# Patient Record
Sex: Female | Born: 1990 | Race: Asian | Hispanic: No | Marital: Married | Smoking: Never smoker
Health system: Southern US, Community
[De-identification: ages and names within clinical notes are randomized; demographics above are authoritative.]

## PROBLEM LIST (undated history)

## (undated) DIAGNOSIS — F419 Anxiety disorder, unspecified: Secondary | ICD-10-CM

## (undated) DIAGNOSIS — F32A Depression, unspecified: Secondary | ICD-10-CM

---

## 2016-07-22 ENCOUNTER — Ambulatory Visit (INDEPENDENT_AMBULATORY_CARE_PROVIDER_SITE_OTHER): Payer: 59 | Admitting: Family Medicine

## 2016-07-22 ENCOUNTER — Ambulatory Visit (INDEPENDENT_AMBULATORY_CARE_PROVIDER_SITE_OTHER): Payer: 59

## 2016-07-22 VITALS — BP 109/64 | HR 58

## 2016-07-22 DIAGNOSIS — M25531 Pain in right wrist: Secondary | ICD-10-CM | POA: Diagnosis not present

## 2016-07-22 DIAGNOSIS — S6991XA Unspecified injury of right wrist, hand and finger(s), initial encounter: Secondary | ICD-10-CM

## 2016-07-22 NOTE — Patient Instructions (Signed)
Thank you for coming in today. Let me know how your wrist is feeling.  I recommend you make an appointment with Dr Lyn HollingsheadAlexander for a well visit to establish care and get caught up with a pap smear.     Pap Test Why am I having this test? A pap test is sometimes called a pap smear. It is a screening test that is used to check for signs of cancer of the vagina, cervix, and uterus. The test can also identify the presence of infection or precancerous changes. Your health care provider will likely recommend you have this test done on a regular basis. This test may be done:  Every 3 years, starting at age 26.  Every 5 years, in combination with testing for the presence of human papillomavirus (HPV).  More or less often depending on other medical conditions. What kind of sample is taken? Using a small cotton swab, plastic spatula, or brush, your health care provider will collect a sample of cells from the surface of your cervix. Your cervix is the opening to your uterus, also called a womb. Secretions from the cervix and vagina may also be collected. How do I prepare for this test?  Be aware of where you are in your menstrual cycle. You may be asked to reschedule the test if you are menstruating on the day of the test.  You may need to reschedule if you have a known vaginal infection on the day of the test.  You may be asked to avoid douching or taking a bath the day before or the day of the test.  Some medicines can cause abnormal test results, such as digitalis and tetracycline. Talk with your health care provider before your test if you take one of these medicines. What do the results mean? Abnormal test results may indicate a number of health conditions. These may include:  Cancer. Although pap test results cannot be used to diagnose cancer of the cervix, vagina, or uterus, they may suggest the possibility of cancer. Further tests would be required to determine if cancer is  present.  Sexually transmitted disease.  Fungal infection.  Parasite infection.  Herpes infection.  A condition causing or contributing to infertility. It is your responsibility to obtain your test results. Ask the lab or department performing the test when and how you will get your results. Contact your health care provider to discuss any questions you have about your results. Talk with your health care provider to discuss your results, treatment options, and if necessary, the need for more tests. Talk with your health care provider if you have any questions about your results. This information is not intended to replace advice given to you by your health care provider. Make sure you discuss any questions you have with your health care provider. Document Released: 07/12/2002 Document Revised: 12/26/2015 Document Reviewed: 09/12/2013 Elsevier Interactive Patient Education  2017 [employer]
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
## 2016-08-05 ENCOUNTER — Other Ambulatory Visit (HOSPITAL_COMMUNITY)
Admission: RE | Admit: 2016-08-05 | Discharge: 2016-08-05 | Disposition: A | Discharge status: Home / Self Care | Payer: 59 | Source: Ambulatory Visit | Attending: Osteopathic Medicine | Admitting: Osteopathic Medicine

## 2016-08-05 ENCOUNTER — Encounter: Payer: Self-pay | Admitting: Osteopathic Medicine

## 2016-08-05 ENCOUNTER — Ambulatory Visit (INDEPENDENT_AMBULATORY_CARE_PROVIDER_SITE_OTHER): Payer: 59 | Admitting: Osteopathic Medicine

## 2016-08-05 VITALS — BP 126/63 | HR 72 | Ht 60.5 in | Wt 114.0 lb

## 2016-08-05 DIAGNOSIS — Z0000 Encounter for general adult medical examination without abnormal findings: Secondary | ICD-10-CM | POA: Insufficient documentation

## 2016-08-05 DIAGNOSIS — Z124 Encounter for screening for malignant neoplasm of cervix: Secondary | ICD-10-CM | POA: Diagnosis not present

## 2016-08-05 DIAGNOSIS — Z3009 Encounter for other general counseling and advice on contraception: Secondary | ICD-10-CM | POA: Diagnosis not present

## 2016-08-05 NOTE — Progress Notes (Signed)
HPI: Joyce Crawford is a 26 y.o. female  who presents to [facility] today, 08/05/16,  for chief complaint of:  Chief Complaint  Patient presents with  . Establish Care   Here to establish care with PCP. Due for some preventive medicine measures. Never had Pap smear performed. Patient is married, husband is here to assist with history taking. Patient speaks decent [place] and declines use of interpreter at this time. See below for review of preventive care  Has been spoke to me briefly about episode that she had which sounds like panic/anxiety attacks, happened when she recently moved to [place], they were going into a [employer] when she was hyperventilating and needed to sit down. He states that in [place], she also had some kind of what sounds like stress test performed, treadmill test for the heart which was normal. He is not sure why she needed this. Patient states that she used to have chest pains but does not anymore.    Past medical, surgical, social and family history reviewed: There are no active problems to display for this patient.  No past surgical history on file. Social History  Substance Use Topics  . Smoking status: Not on file  . Smokeless tobacco: Not on file  . Alcohol use Not on file   No family history on file.   Current medication list and allergy/intolerance information reviewed:   No current outpatient prescriptions on file.   No current facility-administered medications for this visit.    Not on File    Review of Systems:  Constitutional:  No  fever, no chills, No recent illness, No unintentional weight changes. No significant fatigue.   HEENT: No  headache, no vision change  Cardiac: No  chest pain, No  pressure, No palpitations  Respiratory:  No  shortness of breath. No  Cough  Gastrointestinal: No  abdominal pain, No  nausea, No  vomiting,  No  blood in stool, No  diarrhea, No  constipation    Musculoskeletal: No new myalgia/arthralgia  Genitourinary: No  abnormal genital bleeding, No abnormal genital discharge  Skin: No  Rash, No other wounds/concerning lesions  Neurologic: No  weakness, No  dizziness  Psychiatric: No  concerns with depression, No  concerns with anxiety, No sleep problems, No mood problems  Exam:  BP 126/63   Pulse 72   Ht 5' 0.5" (1.537 m)   Wt 114 lb (51.7 kg)   LMP 07/08/2016   BMI 21.90 kg/m   Constitutional: VS see above. General Appearance: alert, well-developed, well-nourished, NAD  Eyes: Normal lids and conjunctive, non-icteric sclera  Ears, Nose, Mouth, Throat: MMM, Normal external inspection ears/nares/mouth/lips/gums.   Neck: No masses, trachea midline. No thyroid enlargement. No tenderness/mass appreciated. No lymphadenopathy  Respiratory: Normal respiratory effort. no wheeze, no rhonchi, no rales  Cardiovascular: S1/S2 normal, no murmur, no rub/gallop auscultated. RRR. No lower extremity edema.   Gastrointestinal: Nontender, no masses. No hepatomegaly, no splenomegaly. No hernia appreciated. Bowel sounds normal. Rectal exam deferred.   Musculoskeletal: Gait normal. No clubbing/cyanosis of digits.   Neurological: Normal balance/coordination. No tremor.   Skin: warm, dry, intact.  Psychiatric: Normal judgment/insight. Normal mood and affect. Oriented x3.  GYN: No lesions/ulcers to external genitalia, normal urethra, normal vaginal mucosa, physiologic discharge, cervix normal without lesions, uterus not enlarged or tender, adnexa no masses and nontender    Results for orders placed or performed in visit on 08/05/16 (from the past 72 hour(s))  CBC  Status: None   Collection Time: 08/05/16  4:49 PM  Result Value Ref Range   WBC 7.4 3.8 - 10.8 K/uL   RBC 4.63 3.80 - 5.10 MIL/uL   Hemoglobin 13.2 11.7 - 15.5 g/dL   HCT 65.7 84.6 - 96.2 %   MCV 85.1 80.0 - 100.0 fL   MCH 28.5 27.0 - 33.0 pg   MCHC 33.5 32.0 - 36.0 g/dL    RDW 95.2 84.1 - 32.4 %   Platelets 347 140 - 400 K/uL   MPV 9.6 7.5 - 12.5 fL  COMPLETE METABOLIC PANEL WITH GFR     Status: Abnormal   Collection Time: 08/05/16  4:49 PM  Result Value Ref Range   Sodium 138 135 - 146 mmol/L   Potassium 4.0 3.5 - 5.3 mmol/L   Chloride 105 98 - 110 mmol/L   CO2 23 20 - 31 mmol/L   Glucose, Bld 89 65 - 99 mg/dL   BUN 12 7 - 25 mg/dL   Creat 4.01 0.27 - 2.53 mg/dL   Total Bilirubin 0.2 0.2 - 1.2 mg/dL   Alkaline Phosphatase 61 33 - 115 U/L   AST 17 10 - 30 U/L   ALT 4 (L) 6 - 29 U/L   Total Protein 7.7 6.1 - 8.1 g/dL   Albumin 4.4 3.6 - 5.1 g/dL   Calcium 9.3 8.6 - 66.4 mg/dL   GFR, Est African American >89 >=60 mL/min   GFR, Est Non African American >89 >=60 mL/min  TSH     Status: None   Collection Time: 08/05/16  4:49 PM  Result Value Ref Range   TSH 2.13 mIU/L    Comment:   Reference Range   > or = 20 Years  0.40-4.50   Pregnancy Range First trimester  0.26-2.66 Second trimester 0.55-2.73 Third trimester  0.43-2.91         ASSESSMENT/PLAN:   Annual physical exam - No major medical problems. Questionable history of possible anxiety. Labs for reassurance - Plan: CBC, COMPLETE METABOLIC PANEL WITH GFR, TSH  Cervical cancer screening - Plan: Cytology - PAP  Encounter for other general counseling or advice on contraception - Patient would like something she doesn't have to think about every day, will consider arm implant IUD versus vaginal ring    FEMALE PREVENTIVE CARE Updated 08/06/16   ANNUAL SCREENING/COUNSELING  Diet/Exercise - HEALTHY HABITS DISCUSSED TO DECREASE CV RISK History  Smoking Status  . Never Smoker  Smokeless Tobacco  . Never Used   History  Alcohol Use No    Comment: none   Depression screen PHQ 2/9 08/06/2016  Decreased Interest 0  Down, Depressed, Hopeless 0  PHQ - 2 Score 0    Domestic violence concerns - no  HTN SCREENING - SEE VITALS  SEXUAL HEALTH  Sexually active in the past year - Yes  with female.  Need/want STI testing today? - no  Concerns about libido or pain with sex? - no  Plans for pregnancy? - next few years - not on contraception plan currently  INFECTIOUS DISEASE SCREENING  HIV - needs - declined  GC/CT - needs  HepC - DOB [birth year] - does not need  TB - does not need  DISEASE SCREENING  Lipid - does not need  DM2 - does not need  Osteoporosis - women age 38+ - does not need  CANCER SCREENING  Cervical - needs  Breast - does not need  Lung - does not need  Colon - does not  need  ADULT VACCINATION  Influenza - annual vaccine recommended  Td - booster every 10 years   Zoster - option at 7, yes at 60+   PCV13 - was not indicated  PPSV23 - was not indicated  There is no immunization history on file for this patient. Husband states they will bring vaccination records from home       Visit summary with medication list and pertinent instructions was printed for patient to review. All questions at time of visit were answered - patient instructed to contact office with any additional concerns. ER/RTC precautions were reviewed with the patient. Follow-up plan: Return for birth control dicsussion if desired, and depending on results .

## 2016-08-05 NOTE — Patient Instructions (Signed)
Contraception Choices Contraception (birth control) is the use of any methods or devices to prevent pregnancy. Below are some methods to help avoid pregnancy. Hormonal methods  Contraceptive implant. This is a thin, plastic tube containing progesterone hormone. It does not contain estrogen hormone. Your health care provider inserts the tube in the inner part of the upper arm. The tube can remain in place for up to 3 years. After 3 years, the implant must be removed. The implant prevents the ovaries from releasing an egg (ovulation), thickens the cervical mucus to prevent sperm from entering the uterus, and thins the lining of the inside of the uterus.  Progesterone-only injections. These injections are given every 3 months by your health care provider to prevent pregnancy. This synthetic progesterone hormone stops the ovaries from releasing eggs. It also thickens cervical mucus and changes the uterine lining. This makes it harder for sperm to survive in the uterus.  Birth control pills. These pills contain estrogen and progesterone hormone. They work by preventing the ovaries from releasing eggs (ovulation). They also cause the cervical mucus to thicken, preventing the sperm from entering the uterus. Birth control pills are prescribed by a health care provider.Birth control pills can also be used to treat heavy periods.  Minipill. This type of birth control pill contains only the progesterone hormone. They are taken every day of each month and must be prescribed by your health care provider.  Birth control patch. The patch contains hormones similar to those in birth control pills. It must be changed once a week and is prescribed by a health care provider.  Vaginal ring. The ring contains hormones similar to those in birth control pills. It is left in the vagina for 3 weeks, removed for 1 week, and then a new one is put back in place. The patient must be comfortable inserting and removing the ring from  the vagina.A health care provider's prescription is necessary.  Emergency contraception. Emergency contraceptives prevent pregnancy after unprotected sexual intercourse. This pill can be taken right after sex or up to 5 days after unprotected sex. It is most effective the sooner you take the pills after having sexual intercourse. Most emergency contraceptive pills are available without a prescription. Check with your pharmacist. Do not use emergency contraception as your only form of birth control. Barrier methods  Female condom. This is a thin sheath (latex or rubber) that is worn over the penis during sexual intercourse. It can be used with spermicide to increase effectiveness.  Female condom. This is a soft, loose-fitting sheath that is put into the vagina before sexual intercourse.  Diaphragm. This is a soft, latex, dome-shaped barrier that must be fitted by a health care provider. It is inserted into the vagina, along with a spermicidal jelly. It is inserted before intercourse. The diaphragm should be left in the vagina for 6 to 8 hours after intercourse.  Cervical cap. This is a round, soft, latex or plastic cup that fits over the cervix and must be fitted by a health care provider. The cap can be left in place for up to 48 hours after intercourse.  Sponge. This is a soft, circular piece of polyurethane foam. The sponge has spermicide in it. It is inserted into the vagina after wetting it and before sexual intercourse.  Spermicides. These are chemicals that kill or block sperm from entering the cervix and uterus. They come in the form of creams, jellies, suppositories, foam, or tablets. They do not require a prescription. They   are inserted into the vagina with an applicator before having sexual intercourse. The process must be repeated every time you have sexual intercourse. Intrauterine contraception  Intrauterine device (IUD). This is a T-shaped device that is put in a woman's uterus during  a menstrual period to prevent pregnancy. There are 2 types:  Copper IUD. This type of IUD is wrapped in copper wire and is placed inside the uterus. Copper makes the uterus and fallopian tubes produce a fluid that kills sperm. It can stay in place for 10 years.  Hormone IUD. This type of IUD contains the hormone progestin (synthetic progesterone). The hormone thickens the cervical mucus and prevents sperm from entering the uterus, and it also thins the uterine lining to prevent implantation of a fertilized egg. The hormone can weaken or kill the sperm that get into the uterus. It can stay in place for 3-5 years, depending on which type of IUD is used. Permanent methods of contraception  Female tubal ligation. This is when the woman's fallopian tubes are surgically sealed, tied, or blocked to prevent the egg from traveling to the uterus.  Hysteroscopic sterilization. This involves placing a small coil or insert into each fallopian tube. Your doctor uses a technique called hysteroscopy to do the procedure. The device causes scar tissue to form. This results in permanent blockage of the fallopian tubes, so the sperm cannot fertilize the egg. It takes about 3 months after the procedure for the tubes to become blocked. You must use another form of birth control for these 3 months.  Female sterilization. This is when the female has the tubes that carry sperm tied off (vasectomy).This blocks sperm from entering the vagina during sexual intercourse. After the procedure, the man can still ejaculate fluid (semen). Natural planning methods  Natural family planning. This is not having sexual intercourse or using a barrier method (condom, diaphragm, cervical cap) on days the woman could become pregnant.  Calendar method. This is keeping track of the length of each menstrual cycle and identifying when you are fertile.  Ovulation method. This is avoiding sexual intercourse during ovulation.  Symptothermal method.  This is avoiding sexual intercourse during ovulation, using a thermometer and ovulation symptoms.  Post-ovulation method. This is timing sexual intercourse after you have ovulated. Regardless of which type or method of contraception you choose, it is important that you use condoms to protect against the transmission of sexually transmitted infections (STIs). Talk with your health care provider about which form of contraception is most appropriate for you. This information is not intended to replace advice given to you by your health care provider. Make sure you discuss any questions you have with your health care provider. Document Released: 04/21/2005 Document Revised: 09/27/2015 Document Reviewed: 10/14/2012 Elsevier Interactive Patient Education  2017 [employer].   Cc bi?n php trnh [place] (Contraception Choices) [place] thai (ki?m sot sinh s?n) l vi?c s? d?ng b?t k? ph??ng php ho?c d?ng c? no ?? trnh [place]. D??i ?y l m?t s? ph??ng php trnh [place]. PH??NG PHP HOC MN  C?y ghp trnh [place]. ?y l m?t ?ng m?ng b?ng nh?a ch?a hoc mn progesterone. N khng ch?a hoc mn estrogen. Chuyn gia ch?m [place] s?c kh?e c?a qu v? s? lu?n ?ng vo ph?n bn trong c?a cnh tay pha trn. ?ng c th? v?n ? ? cho ??n 3 n?m. Sau 3 n?m, ?ng c?y ghp ph?i ???c tho ra. ?ng c?y ghp ng?n khng cho bu?ng tr?ng nh? tr?ng ra (r?ng tr?ng), lm ??c  ch?t nh?y ? c? t? cung ?? ng?n khng cho tinh trng xm nh?p vo t? cung, v lm m?ng nim m?c bn trong t? cung.  Tim ch? ring progesterone. Chuyn gia ch?m [place] s?c kh?e s? tim cho qu v? 3 thng m?t l?n ?? trnh [place]. Hoc mn progesterone t?ng h?p ny d?ng vi?c r?ng tr?ng t? bu?ng tr?ng. N c?ng lm ??c ch?t nh?y c? t? cung v thay ??i n?i m?c t? cung. ?i?u ny lm cho tinh trng kh t?n t?i h?n trong t? cung.  Vin thu?c trnh [place]. Nh?ng vin thu?c ny ch?a hoc mn estrogen v progesterone. Chng ho?t ??ng b?ng cch ng?n khng cho bu?ng tr?ng gi?i phng tr?ng  (r?ng tr?ng). Chng c?ng lm ch?t nh?y ? c? t? cung ??c l?i, t? ? ng?n c?n tinh trng xm nh?p vo t? cung. Thu?c trnh [place] ???c chuyn gia ch?m [place] s?c kh?e k ??n.Thu?c trnh [place] c?ng c th? ???c dng ?? ?i?u tr? rong kinh.  Vin thu?c minipil. Lo?i vin trnh [place] ny ch? ch?a hoc mn progesterone. Chng ???c dng hng ngy c?a m?i thng v ph?i ???c chuyn gia ch?m [place] s?c kh?e c?a qu v? k ??n.  Mi?ng dn trnh [place]. Mi?ng dn c ch?a hoc mn t??ng t? nh? trong vin thu?c trnh [place]. N ph?i ???c thay ??i m?t l?n m?t tu?n v ph?i do chuyn gia ch?m [place] s?c kh?e k ??n.  Vng ??t m ??o. Vng ??t c ch?a hoc mn t??ng t? nh? trong vin thu?c trnh [place]. N n?m l?i trong m ??o trong 3 tu?n, ???c tho ra trong 1 tu?n, v sau ? m?t vng m?i ???c ??t l?i vo ch? c?. B?nh nhn ph?i th?y tho?i mi khi ??t v tho vng t? m ??o.C?n ph?i c ??n thu?c c?a m?t chuyn gia ch?m [place] s?c kh?e.  [place] thai kh?n c?p. Thu?c trnh thai kh?n c?p ng?n ng?a vi?c mang thai sau khi quan h? tnh d?c khng c bi?n php b?o v?. Vin thu?c ny c th? u?ng ngay sau khi quan h? tnh d?c ho?c ??n 5 ngy sau khi quan h? tnh d?c khng c bi?n php b?o v?. Thu?c ny hi?u qu? nh?t khi qu v? dng thu?c s?m sau khi quan h? tnh d?c. H?u h?t cc vin thu?c trnh [place] kh?n c?p c bn m khng c?n k ??n. Ki?m tra v?i d??c s? c?a qu v?. Khng s? d?ng bi?n php trnh [place] kh?n c?p nh? l hnh th?c duy nh?t ?? ki?m sot sinh ??. PH??NG PHP MNG NG?N  Bao cao su cho nam. ?y l m?t bao m?ng (latex hay cao su) ???c ?eo vo d??ng v?t trong khi giao h?p. N c th? ???c s? d?ng v?i ch?t di?t tinh trng ?? t?ng hi?u qu?[place] su cho n?. ?y l m?t mng m?m, r?ng ???c ??a vo m ??o tr??c khi giao h?p.  Mng ch?n. ?y l m?t mng ch?n m?m b?ng latex, c hnh vm m ph?i ???c m?t chuyn gia ch?m [place] s?c kh?e ??t. N ???c ??t vo m ??o, cng v?i m?t ch?t th?ch ?? di?t tinh trng. N ???c ??t vo tr??c khi giao h?p. Mng ch?n  nn ?? trong m ??o trong 6-8 gi? sau khi giao h?p.  M? c? t? cung. ?y l m?t c?c hnh trn, m?m b?ng latex ??t ln c? t? cung v ph?i ???c m?t chuyn gia ch?m [place] s?c kh?e ??t. M? ny c th? ???c gi? ? t?i ch? cho ??n  48 gi? sau khi giao h?p.  Mi?ng x?p. ?y l m?t mi?ng x?p polyurethane m?m. Mi?ng x?p c ch?t di?t tinh trng trong ?. N ???c ??a vo m ??o sau khi lm ??t v tr??c khi giao h?p.  Ch?t di?t tinh trng. ?y l nh?ng ha ch?t di?t ho?c ng?n khng cho tinh trng xm nh?p vo c? t? cung v t? cung. Chng c d??i d?ng kem, th?ch, thu?c ??n, b?t, ho?c vin. Nh?ng ch?t ny khng c?n k ??n thu?c. Chng ???c ??a vo m ??o b?ng m?t d?ng c? bi tr??c khi giao h?p. Qu trnh ny ph?i ???c l?p l?i m?i khi qu v? giao h?p. TRNH THAI TRONG T? CUNG  Vng trnh [place] (IUD). ?y l m?t d?ng c? hnh ch? T ???c ??t vo t? cung c?a ng??i ph? n? trong k? kinh nguy?t ?? trnh [place]. C 2 lo?i vng trnh thai:  IUD b?ng ??ng. ?y l lo?i IUD ???c b?c trong dy ??ng v ???c ??t bn trong t? cung. ??ng lm cho t? cung v ?ng d?n tr?ng s?n xu?t ra m?t ch?t l?ng tiu di?t tinh trng. N c th? ? l?i t?i ch? trong 10 n?m.  IUD hoc mn. ?y l lo?i IUD ch?a progestin hoc mn (progesterone t?ng h?p). Hoc mn s? lm ??c ch?t nh?y c? t? cung v ng?n khng cho tinh trng xm nh?p vo t? cung, v n c?ng lm m?ng nim m?c t? cung ?? ng?n ch?n vi?c c?y tr?ng ? th? tinh. Hoc mn c th? lm suy y?u ho?c tiu di?t tinh trng xm nh?p vo t? cung. N c th? ? l?i t?i ch? trong 3-5 n?m, ty thu?c vo lo?i vng trnh [place] ???c s? d?ng. CC PH??NG PHP TRNH THAI V?NH VI?N  Th?t ?ng d?n tr?ng ? n?. ? l khi ?ng d?n tr?ng c?a ng??i ph? n? ???c b?t, th?t, ho?c ?ng l?i b?ng ph?u thu?t ?? ng?n khng cho tr?ng ?i vo t? cung.  Tri?t s?n b?ng n?i soi t? cung. Bi?n php ny bao g?m vi?c ??t m?t cu?n dy nh? ho?c lu?n vo t?ng ?ng d?n tr?ng. Bc s? c?a qu v? s? d?ng m?t k? thu?t g?i l n?i soi t? cung ?? lm th? thu?t  ny. D?ng c? ny lm hnh thnh m s?o. ?i?u ny d?n ??n t?c ?ng d?n tr?ng v?nh vi?n, do ? tinh trng khng th? th? tinh cho tr?ng. C?n kho?ng 3 thng sau khi lm th? thu?t ?? cho cc ?ng d?n tr?ng b? t?c. Qu v? ph?i s? d?ng m?t hnh th?c trnh [place] khc trong 3 thng ny.  Tri?t s?n nam. ? l khi nam gi?i th?t cc ?ng d?n tinh (th?t ?ng d?n tinh).Th? thu?t ny ng?n khng cho tinh trng vo m ??o khi giao h?p. Sau khi lm th? thu?t, nam gi?i v?n c th? xu?t ra ch?t l?ng (tinh d?ch). CC PH??NG PHP K? HO?CH T? NHIN  K? ho?ch ha gia ?nh t? nhin. ? l khng c giao h?p ho?c s? d?ng m?t ph??ng php mng ch?n (bao cao su, mng ng?n, m? c? t? cung) vo nh?ng ngy ng??i ph? n? c th? mang [place].  Ph??ng php theo l?ch. ?y l vi?c theo di ?? di c?a m?i chu k? kinh nguy?t v xc ??nh khi no qu v? c th? mang [place].  Ph??ng php r?ng tr?ng. ?y l vi?c trnh giao h?p trong th?i gian r?ng tr?ng.  Ph??ng php tri?u ch?ng - nhi?t (Symptothermal). ?y l vi?c trnh giao h?p trong th?i gian  r?ng tr?ng, s? d?ng m?t nhi?t k? v cc tri?u ch?ng c?a r?ng tr?ng.  Ph??ng php sau r?ng tr?ng. ?y l vi?c ??nh th?i gian giao h?p sau khi qu v? ? r?ng tr?ng. B?t k? lo?i ho?c bi?n php trnh [place] m qu v? ch?n, ?i?u quan tr?ng l qu v? ph?i s? d?ng bao cao su ?? b?o v? ch?ng l?i s? ly nhi?m c?a cc b?nh qua ???ng tnh d?c (STI). Ni chuy?n v?i chuyn gia ch?m [place] s?c kh?e c?a qu v? v? ph??ng php trnh [place] thch h?p nh?t cho qu v?. Thng tin ny khng nh?m m?c ?ch thay th? cho l?i khuyn m chuyn gia ch?m [place] s?c kh?e ni v?i qu v?. Hy b?o ??m qu v? ph?i th?o lu?n b?t k? v?n ?? g m qu v? c v?i chuyn gia ch?m [place] s?c kh?e c?a qu v?. Document Released: 05/18/2015 Document Revised: 05/18/2015 Document Reviewed: 10/14/2012 Elsevier Interactive Patient Education  2017 [employer].

## 2016-08-06 ENCOUNTER — Encounter: Payer: Self-pay | Admitting: Osteopathic Medicine

## 2016-08-06 LAB — CBC
HEMATOCRIT: 39.4 % (ref 35.0–45.0)
HEMOGLOBIN: 13.2 g/dL (ref 11.7–15.5)
MCH: 28.5 pg (ref 27.0–33.0)
MCHC: 33.5 g/dL (ref 32.0–36.0)
MCV: 85.1 fL (ref 80.0–100.0)
MPV: 9.6 fL (ref 7.5–12.5)
Platelets: 347 10*3/uL (ref 140–400)
RBC: 4.63 MIL/uL (ref 3.80–5.10)
RDW: 13 % (ref 11.0–15.0)
WBC: 7.4 10*3/uL (ref 3.8–10.8)

## 2016-08-06 LAB — COMPLETE METABOLIC PANEL WITH GFR
ALBUMIN: 4.4 g/dL (ref 3.6–5.1)
ALK PHOS: 61 U/L (ref 33–115)
ALT: 4 U/L — ABNORMAL LOW (ref 6–29)
AST: 17 U/L (ref 10–30)
BUN: 12 mg/dL (ref 7–25)
CALCIUM: 9.3 mg/dL (ref 8.6–10.2)
CO2: 23 mmol/L (ref 20–31)
CREATININE: 0.75 mg/dL (ref 0.50–1.10)
Chloride: 105 mmol/L (ref 98–110)
GFR, Est African American: >89 mL/min (ref >=60)
GFR, Est Non African American: >89 mL/min (ref >=60)
GLUCOSE: 89 mg/dL (ref 65–99)
POTASSIUM: 4 mmol/L (ref 3.5–5.3)
SODIUM: 138 mmol/L (ref 135–146)
TOTAL PROTEIN: 7.7 g/dL (ref 6.1–8.1)
Total Bilirubin: 0.2 mg/dL (ref 0.2–1.2)

## 2016-08-06 LAB — TSH: TSH: 2.13 m[IU]/L

## 2016-08-08 LAB — CYTOLOGY - PAP
CHLAMYDIA, DNA PROBE: NEGATIVE
Diagnosis: NEGATIVE
Neisseria Gonorrhea: NEGATIVE

## 2018-01-22 ENCOUNTER — Ambulatory Visit: Payer: 59 | Admitting: Family Medicine

## 2018-01-22 VITALS — BP 120/70 | HR 71 | Temp 97.8°F | Wt 125.0 lb

## 2018-01-22 DIAGNOSIS — J029 Acute pharyngitis, unspecified: Secondary | ICD-10-CM

## 2018-01-22 LAB — POCT RAPID STREP A (OFFICE): Rapid Strep A Screen: NEGATIVE

## 2018-01-22 MED ORDER — BENZONATATE 200 MG PO CAPS: 200.0000 mg | ORAL_CAPSULE | Freq: Three times a day (TID) | ORAL | 0 refills | Status: DC | PRN

## 2018-01-22 NOTE — Progress Notes (Signed)
       Joyce Crawford is a 27 y.o. female [facility] today for 3-day history of sore throat with mild cough.  Mild subjective fevers.  No vomiting diarrhea chest pain palpitations.  Patient is tried some over-the-counter medications which help a bit.  She notes the cough is not particularly bothersome.  No chest pain palpitations or diarrhea.   ROS as above:  Exam:  BP 120/70   Pulse 71   Temp 97.8 F (36.6 C) (Oral)   Wt 125 lb (56.7 kg)   SpO2 100%   BMI 24.01 kg/m  Wt Readings from Last 5 Encounters:  01/22/18 125 lb (56.7 kg)  08/05/16 114 lb (51.7 kg)    Gen: Well NAD HEENT: EOMI,  MMM posterior pharynx minimally erythematous.  Mild cervical lymphadenopathy.  Normal tympanic membranes bilaterally. Lungs: Normal work of breathing. CTABL Heart: RRR no MRG Abd: NABS, Soft. Nondistended, Nontender Exts: Brisk capillary refill, warm and well perfused.   Lab and Radiology Results Results for orders placed or performed in visit on 01/22/18 (from the past 72 hour(s))  POCT rapid strep A     Status: None   Collection Time: 01/22/18 11:43 AM  Result Value Ref Range   Rapid Strep A Screen Negative Negative   No results found.    Assessment and Plan: 27 y.o. female with viral URI.  Plan for watchful waiting with symptomatic management with over-the-counter medications.  Prescribed Tessalon Perles for cough suppression if needed.  Recheck in the future if not improving.  Recommend patient follow-up with PCP in a month for a well woman visit and receive influenza and Tdap vaccine at that visit. Orders Placed This Encounter  Procedures  . POCT rapid strep A   Meds ordered this encounter  Medications  . benzonatate (TESSALON) 200 MG capsule    Sig: Take 1 capsule (200 mg total) by mouth 3 (three) times daily as needed for cough.    Dispense:  45  capsule    Refill:  0     Historical information moved to improve visibility of documentation.  No past medical history on file.  Reviewed No past surgical history on file. Social History   Tobacco Use  . Smoking status: Never Smoker  . Smokeless tobacco: Never Used  Substance Use Topics  . Alcohol use: No    Comment: none   family history is not on file.  Medications: Current Outpatient Medications  Medication Sig Dispense Refill  . benzonatate (TESSALON) 200 MG capsule Take 1 capsule (200 mg total) by mouth 3 (three) times daily as needed for cough. 45 capsule 0   No current facility-administered medications for this visit.    No Known Allergies   Discussed warning signs or symptoms. Please see discharge instructions. Patient expresses understanding.

## 2018-01-22 NOTE — Patient Instructions (Addendum)
Thank you for coming in today. Continue over the counter medicine.  Maxum dose of acetaminophen is 1000mg  every 6 hours.  Ok to also take ibuprofen with acetaminophen.  Fill and take the prescription cough medicine as needed if not improving or if worsening.  Recheck as needed.    Upper Respiratory Infection, Adult Most upper respiratory infections (URIs) are a viral infection of the air passages leading to the lungs. A URI affects the nose, throat, and upper air passages. The most common type of URI is nasopharyngitis and is typically referred to as "the common cold." URIs run their course and usually go away on their own. Most of the time, a URI does not require medical attention, but sometimes a bacterial infection in the upper airways can follow a viral infection. This is called a secondary infection. Sinus and middle ear infections are common types of secondary upper respiratory infections. Bacterial pneumonia can also complicate a URI. A URI can worsen asthma and chronic obstructive pulmonary disease (COPD). Sometimes, these complications can require emergency medical care and may be life threatening. What are the causes? Almost all URIs are caused by viruses. A virus is a type of germ and can spread from one person to another. What increases the risk? You may be at risk for a URI if:  You smoke.  You have chronic heart or lung disease.  You have a weakened defense (immune) system.  You are very young or very old.  You have nasal allergies or asthma.  You work in crowded or poorly ventilated areas.  You work in health care facilities or schools.  What are the signs or symptoms? Symptoms typically develop 2-3 days after you come in contact with a cold virus. Most viral URIs last 7-10 days. However, viral URIs from the influenza virus (flu virus) can last 14-18 days and are typically more severe. Symptoms may include:  Runny or stuffy (congested)  nose.  Sneezing.  Cough.  Sore throat.  Headache.  Fatigue.  Fever.  Loss of appetite.  Pain in your forehead, behind your eyes, and over your cheekbones (sinus pain).  Muscle aches.  How is this diagnosed? Your health care provider may diagnose a URI by:  Physical exam.  Tests to check that your symptoms are not due to another condition such as: ? Strep throat. ? Sinusitis. ? Pneumonia. ? Asthma.  How is this treated? A URI goes away on its own with time. It cannot be cured with medicines, but medicines may be prescribed or recommended to relieve symptoms. Medicines may help:  Reduce your fever.  Reduce your cough.  Relieve nasal congestion.  Follow these instructions at home:  Take medicines only as directed by your health care provider.  Gargle warm saltwater or take cough drops to comfort your throat as directed by your health care provider.  Use a warm mist humidifier or inhale steam from a shower to increase air moisture. This may make it easier to breathe.  Drink enough fluid to keep your urine clear or pale yellow.  Eat soups and other clear broths and maintain good nutrition.  Rest as needed.  Return to work when your temperature has returned to normal or as your health care provider advises. You may need to stay home longer to avoid infecting others. You can also use a face mask and careful hand washing to prevent spread of the virus.  Increase the usage of your inhaler if you have asthma.  Do not use any  tobacco products, including cigarettes, chewing tobacco, or electronic cigarettes. If you need help quitting, ask your health care provider. How is this prevented? The best way to protect yourself from getting a cold is to practice good hygiene.  Avoid oral or hand contact with people with cold symptoms.  Wash your hands often if contact occurs.  There is no clear evidence that vitamin C, vitamin E, echinacea, or exercise reduces the chance  of developing a cold. However, it is always recommended to get plenty of rest, exercise, and practice good nutrition. Contact a health care provider if:  You are getting worse rather than better.  Your symptoms are not controlled by medicine.  You have chills.  You have worsening shortness of breath.  You have brown or red mucus.  You have yellow or brown nasal discharge.  You have pain in your face, especially when you bend forward.  You have a fever.  You have swollen neck glands.  You have pain while swallowing.  You have white areas in the back of your throat. Get help right away if:  You have severe or persistent: ? Headache. ? Ear pain. ? Sinus pain. ? Chest pain.  You have chronic lung disease and any of the following: ? Wheezing. ? Prolonged cough. ? Coughing up blood. ? A change in your usual mucus.  You have a stiff neck.  You have changes in your: ? Vision. ? Hearing. ? Thinking. ? Mood. This information is not intended to replace advice given to you by your health care provider. Make sure you discuss any questions you have with your health care provider. Document Released: 10/15/2000 Document Revised: 12/23/2015 Document Reviewed: 07/27/2013 Elsevier Interactive Patient Education  [employer]2018 [employer]

## 2020-05-02 ENCOUNTER — Other Ambulatory Visit: Payer: Self-pay

## 2020-05-02 ENCOUNTER — Ambulatory Visit (INDEPENDENT_AMBULATORY_CARE_PROVIDER_SITE_OTHER): Payer: Managed Care, Other (non HMO) | Admitting: Osteopathic Medicine

## 2020-05-02 ENCOUNTER — Encounter: Payer: Self-pay | Admitting: Osteopathic Medicine

## 2020-05-02 VITALS — BP 117/74 | HR 70 | Temp 98.0°F | Wt 126.1 lb

## 2020-05-02 DIAGNOSIS — Z3201 Encounter for pregnancy test, result positive: Secondary | ICD-10-CM | POA: Diagnosis not present

## 2020-05-02 DIAGNOSIS — N926 Irregular menstruation, unspecified: Secondary | ICD-10-CM | POA: Diagnosis not present

## 2020-05-02 DIAGNOSIS — Z3A08 8 weeks gestation of pregnancy: Secondary | ICD-10-CM | POA: Diagnosis not present

## 2020-05-02 LAB — POCT URINE PREGNANCY: Preg Test, Ur: POSITIVE — AB

## 2020-05-02 MED ORDER — ONDANSETRON 8 MG PO TBDP: 8.0000 mg | ORAL_TABLET | Freq: Three times a day (TID) | ORAL | 1 refills | Status: DC | PRN

## 2020-05-02 NOTE — Patient Instructions (Addendum)
Never mind, we don't need a blood test! Pregnancy test is positive! I will refer you to OBGYN. I will send some medicine for nausea.   Results for orders placed or performed in visit on 05/02/20 (from the past 24 hour(s))  POCT urine pregnancy     Status: Abnormal   Collection Time: 05/02/20  3:57 PM  Result Value Ref Range   Preg Test, Ur Positive (A) Negative

## 2020-05-02 NOTE — Progress Notes (Signed)
Joyce Crawford is a 29 y.o. female who presents to  [facility] at [facility]  today, 05/02/20, seeking care for the following:  . LMP 03/01/2020     ASSESSMENT & PLAN with other pertinent findings:  The primary encounter diagnosis was [redacted] weeks gestation of pregnancy. A diagnosis of Missed period was also pertinent to this visit.   No results found for this or any previous visit (from the past 24 hour(s)).     Patient Instructions   Never mind, we don't need a blood test! Pregnancy test is positive! I will refer you to OBGYN. I will send some medicine for nausea.   Results for orders placed or performed in visit on 05/02/20 (from the past 24 hour(s))  POCT urine pregnancy     Status: Abnormal   Collection Time: 05/02/20  3:57 PM  Result Value Ref Range   Preg Test, Ur Positive (A) Negative       Orders Placed This Encounter  Procedures  . Ambulatory referral to Obstetrics / Gynecology  . POCT urine pregnancy    Meds ordered this encounter  Medications  . ondansetron (ZOFRAN-ODT) 8 MG disintegrating tablet    Sig: Take 1 tablet (8 mg total) by mouth every 8 (eight) hours as needed for nausea.    Dispense:  60 tablet    Refill:  1       Follow-up instructions: Return for recheck as needed.                                         BP 117/74   Pulse 70   Temp 98 F (36.7 C)   Wt 126 lb 1.9 oz (57.2 kg)   LMP 03/01/2020 Comment: per patient  SpO2 99%   BMI 24.23 kg/m   Current Meds  Medication Sig  . benzonatate (TESSALON) 200 MG capsule Take 1 capsule (200 mg total) by mouth 3 (three) times daily as needed for cough.  . ondansetron (ZOFRAN-ODT) 8 MG disintegrating tablet Take 1 tablet (8 mg total) by mouth every 8 (eight) hours as needed for nausea.    Results for orders placed or performed in visit on 05/02/20 (from the past 72 hour(s))  POCT urine pregnancy     Status:  Abnormal   Collection Time: 05/02/20  3:57 PM  Result Value Ref Range   Preg Test, Ur Positive (A) Negative    No results found.     All questions at time of visit were answered - patient instructed to contact office with any additional concerns or updates.  ER/RTC precautions were reviewed with the patient as applicable.   Please note: voice recognition software was used to produce this document, and typos may escape review. Please contact Dr. Lyn Hollingshead for any needed clarifications.   Total time spent in this in-person encounter was 30 minutes performing my duty and my job as a physician: record review, history-taking and assessment of patient, counseling patient and ensuring understanding of plan, coordinating care which may include but is not limited to further testing or referral. Any modifiers needed may be added by billing department as necessary if I have lapsed in doing so.

## 2020-05-14 ENCOUNTER — Encounter: Payer: Self-pay | Admitting: *Deleted

## 2020-05-15 ENCOUNTER — Ambulatory Visit (INDEPENDENT_AMBULATORY_CARE_PROVIDER_SITE_OTHER): Payer: Managed Care, Other (non HMO) | Admitting: Certified Nurse Midwife

## 2020-05-15 ENCOUNTER — Encounter: Payer: Self-pay | Admitting: Certified Nurse Midwife

## 2020-05-15 ENCOUNTER — Other Ambulatory Visit: Payer: Self-pay

## 2020-05-15 ENCOUNTER — Other Ambulatory Visit (HOSPITAL_COMMUNITY)
Admission: RE | Admit: 2020-05-15 | Discharge: 2020-05-15 | Disposition: A | Discharge status: Home / Self Care | Payer: Managed Care, Other (non HMO) | Source: Ambulatory Visit | Attending: Certified Nurse Midwife | Admitting: Certified Nurse Midwife

## 2020-05-15 VITALS — BP 121/79 | HR 78 | Wt 125.0 lb

## 2020-05-15 DIAGNOSIS — Z3401 Encounter for supervision of normal first pregnancy, first trimester: Secondary | ICD-10-CM

## 2020-05-15 DIAGNOSIS — Z3A10 10 weeks gestation of pregnancy: Secondary | ICD-10-CM

## 2020-05-15 DIAGNOSIS — Z3400 Encounter for supervision of normal first pregnancy, unspecified trimester: Secondary | ICD-10-CM | POA: Insufficient documentation

## 2020-05-15 NOTE — Progress Notes (Signed)
Bedside U/S shows single IUP with FHT of 163 BPM and CRL measures 33.37mm  GA is [redacted]w[redacted]d

## 2020-05-15 NOTE — Patient Instructions (Addendum)
Safe Medications in Pregnancy   Acne: Benzoyl Peroxide Salicylic Acid  Backache/Headache: Tylenol: 2 regular strength every 4 hours OR              2 Extra strength every 6 hours  Colds/Coughs/Allergies: Benadryl (alcohol free) 25 mg every 6 hours as needed Breath right strips Claritin Cepacol throat lozenges Chloraseptic throat spray Cold-Eeze- up to three times per day Cough drops, alcohol free Flonase (by prescription only) Guaifenesin Mucinex Robitussin DM (plain only, alcohol free) Saline nasal spray/drops Sudafed (pseudoephedrine) & Actifed ** use only after [redacted] weeks gestation and if you do not have high blood pressure Tylenol Vicks Vaporub Zinc lozenges Zyrtec   Constipation: Colace Ducolax suppositories Fleet enema Glycerin suppositories Metamucil Milk of magnesia Miralax Senokot Smooth move tea  Diarrhea: Kaopectate Imodium A-D  *NO pepto Bismol  Hemorrhoids: Anusol Anusol HC Preparation H Tucks  Indigestion: Tums Maalox Mylanta Zantac  Pepcid  Insomnia: Benadryl (alcohol free) 25mg every 6 hours as needed Tylenol PM Unisom, no Gelcaps  Leg Cramps: Tums MagGel  Nausea/Vomiting:  Bonine Dramamine Emetrol Ginger extract Sea bands Meclizine  Nausea medication to take during pregnancy:  Unisom (doxylamine succinate 25 mg tablets) Take one tablet daily at bedtime. If symptoms are not adequately controlled, the dose can be increased to a maximum recommended dose of two tablets daily (1/2 tablet in the morning, 1/2 tablet mid-afternoon and one at bedtime). Vitamin B6 100mg tablets. Take one tablet twice a day (up to 200 mg per day).  Skin Rashes: Aveeno products Benadryl cream or 25mg every 6 hours as needed Calamine Lotion 1% cortisone cream  Yeast infection: Gyne-lotrimin 7 Monistat 7   **If taking multiple medications, please check labels to avoid duplicating the same active ingredients **take medication as directed on  the label ** Do not exceed 4000 mg of tylenol in 24 hours **Do not take medications that contain aspirin or ibuprofen     https://www.cdc.gov/pregnancy/infections.html">  First Trimester of Pregnancy  The first trimester of pregnancy starts on the first day of your last menstrual period until the end of week 12. This is also called months 1 through 3 of pregnancy. Body changes during your first trimester Your body goes through many changes during pregnancy. The changes usually return to normal after your baby is born. Physical changes  You may gain or lose weight.  Your breasts may grow larger and hurt. The area around your nipples may get darker.  Dark spots or blotches may develop on your face.  You may have changes in your hair. Health changes  You may feel like you might vomit (nauseous), and you may vomit.  You may have heartburn.  You may have headaches.  You may have trouble pooping (constipation).  Your gums may bleed. Other changes  You may get tired easily.  You may pee (urinate) more often.  Your menstrual periods will stop.  You may not feel hungry.  You may want to eat certain kinds of food.  You may have changes in your emotions from day to day.  You may have more dreams. Follow these instructions at home: Medicines  Take over-the-counter and prescription medicines only as told by your doctor. Some medicines are not safe during pregnancy.  Take a prenatal vitamin that contains at least 600 micrograms (mcg) of folic acid. Eating and drinking  Eat healthy meals that include: ? Fresh fruits and vegetables. ? Whole grains. ? Good sources of protein, such as meat, eggs, or tofu. ? Low-fat   dairy products.  Avoid raw meat and unpasteurized juice, milk, and cheese.  If you feel like you may vomit, or you vomit: ? Eat 4 or 5 small meals a day instead of 3 large meals. ? Try eating a few soda crackers. ? Drink liquids between meals instead of  during meals.  You may need to take these actions to prevent or treat trouble pooping: ? Drink enough fluids to keep your pee (urine) pale yellow. ? Eat foods that are high in fiber. These include beans, whole grains, and fresh fruits and vegetables. ? Limit foods that are high in fat and sugar. These include fried or sweet foods. Activity  Exercise only as told by your doctor. Most people can do their usual exercise routine during pregnancy.  Stop exercising if you have cramps or pain in your lower belly (abdomen) or low back.  Do not exercise if it is too hot or too humid, or if you are in a place of great height (high altitude).  Avoid heavy lifting.  If you choose to, you may have sex unless your doctor tells you not to. Relieving pain and discomfort  Wear a good support bra if your breasts are sore.  Rest with your legs raised (elevated) if you have leg cramps or low back pain.  If you have bulging veins (varicose veins) in your legs: ? Wear support hose as told by your doctor. ? Raise your feet for 15 minutes, 3-4 times a day. ? Limit salt in your food. Safety  Wear your seat belt at all times when you are in a car.  Talk with your doctor if someone is hurting you or yelling at you.  Talk with your doctor if you are feeling sad or have thoughts of hurting yourself. Lifestyle  Do not use hot tubs, steam rooms, or saunas.  Do not douche. Do not use tampons or scented sanitary pads.  Do not use herbal medicines, illegal drugs, or medicines that are not approved by your doctor. Do not drink alcohol.  Do not smoke or use any products that contain nicotine or tobacco. If you need help quitting, ask your doctor.  Avoid cat litter boxes and soil that is used by cats. These carry germs that can cause harm to the baby and can cause a loss of your baby by miscarriage or stillbirth. General instructions  Keep all follow-up visits. This is important.  Ask for help if you  need counseling or if you need help with nutrition. Your doctor can give you advice or tell you where to go for help.  Visit your dentist. At home, brush your teeth with a soft toothbrush. Floss gently.  Write down your questions. Take them to your prenatal visits. Where to find more information  American Pregnancy Association: americanpregnancy.org  American College of Obstetricians and Gynecologists: www.acog.org  Office on Women's Health: womenshealth.gov/pregnancy Contact a doctor if:  You are dizzy.  You have a fever.  You have mild cramps or pressure in your lower belly.  You have a nagging pain in your belly area.  You continue to feel like you may vomit, you vomit, or you have watery poop (diarrhea) for 24 hours or longer.  You have a bad-smelling fluid coming from your vagina.  You have pain when you pee.  You are exposed to a disease that spreads from person to person, such as chickenpox, measles, Zika virus, HIV, or hepatitis. Get help right away if:  You have spotting or   bleeding from your vagina.  You have very bad belly cramping or pain.  You have shortness of breath or chest pain.  You have any kind of injury, such as from a fall or a car crash.  You have new or increased pain, swelling, or redness in an arm or leg. Summary  The first trimester of pregnancy starts on the first day of your last menstrual period until the end of week 12 (months 1 through 3).  Eat 4 or 5 small meals a day instead of 3 large meals.  Do not smoke or use any products that contain nicotine or tobacco. If you need help quitting, ask your doctor.  Keep all follow-up visits. This information is not intended to replace advice given to you by your health care provider. Make sure you discuss any questions you have with your health care provider. Document Revised: 09/28/2019 Document Reviewed: 08/04/2019 Elsevier Patient Education  2021 Elsevier Inc.  

## 2020-05-15 NOTE — Progress Notes (Signed)
History:   Joyce Crawford is a 30 y.o. G1P0 at [redacted]w[redacted]d by LMP being seen today for her first obstetrical visit.  Her obstetrical history is significant for nothing. Patient does intend to breast feed. Pregnancy history fully reviewed.  Patient reports constipation due to zofran .     HISTORY: OB History  Gravida Para Term Preterm AB Living  1 0 0 0 0 0  SAB IAB Ectopic Multiple Live Births  0 0 0 0 0    # Outcome Date GA Lbr Len/2nd Weight Sex Delivery Anes PTL Lv  1 Current              History reviewed. No pertinent past medical history. History reviewed. No pertinent surgical history. Family History  Problem Relation Age of Onset  . Hypertension Mother    Social History   Tobacco Use  . Smoking status: Never Smoker  . Smokeless tobacco: Never Used  Vaping Use  . Vaping Use: Never used  Substance Use Topics  . Alcohol use: No    Comment: none  . Drug use: No   No Known Allergies Current Outpatient Medications on File Prior to Visit  Medication Sig Dispense Refill  . Prenatal Vit-Fe Fumarate-FA (PRENATAL VITAMIN PO) Take by mouth.    . ondansetron (ZOFRAN-ODT) 8 MG disintegrating tablet Take 1 tablet (8 mg total) by mouth every 8 (eight) hours as needed for nausea. (Patient not taking: Reported on 05/15/2020) 60 tablet 1   No current facility-administered medications on file prior to visit.    Review of Systems Pertinent items noted in HPI and remainder of comprehensive ROS otherwise negative.  Physical Exam:   Vitals:   05/15/20 1404  BP: 121/79  Pulse: 78  Weight: 125 lb (56.7 kg)   Fetal Heart Rate (bpm): 163  General: well-developed, well-nourished female in no acute distress  Breasts:  normal appearance, no masses or tenderness bilaterally  Skin: normal coloration and turgor, no rashes  Neurologic: oriented, normal, negative, normal mood  Extremities: normal strength, tone, and muscle mass, ROM of all joints is normal  HEENT PERRLA, extraocular  movement intact and sclera clear  Neck supple and no masses  Cardiovascular: regular rate and rhythm  Respiratory:  no respiratory distress, normal breath sounds  Abdomen: soft, non-tender; bowel sounds normal; no masses,  no organomegaly  Pelvic: normal external genitalia, no lesions, normal vaginal mucosa, normal vaginal discharge, normal cervix, pap smear done.     Assessment:    Pregnancy: G1P0 Patient Active Problem List   Diagnosis Date Noted  . Supervision of normal first pregnancy 05/15/2020     Plan:    1. Encounter for supervision of normal first pregnancy in first trimester - Welcomed to practice and introduced self to patient and FOB  - Congratulations given to [employer] [employer] and Fayrene Fearing  - Educated and discussed genetic screening during pregnancy - patient desires but unsure if insurance covers, will call insurance then obtain at next appointment  - Routine prenatal care - Anticipatory guidance on upcoming appointments  - Patient reports constipation during pregnancy due to zofran - educated and discussed spacing zofran use, discussed safe medications during pregnancy for constipation and list given, encouraged use of ginger tea to help with nausea  - Obstetric panel - HIV antibody (with reflex) - Hepatitis C Antibody - Culture, OB Urine - Cytology - PAP( [place]) - Hemoglobinopathy Evaluation - [place] bedside; Future - Enroll Patient in Babyscripts - [place] MFM OB COMP + 14 WK; Future  2. [redacted] weeks gestation of pregnancy   Initial labs drawn. Continue prenatal vitamins. Problem list reviewed and updated. Genetic Screening discussed, AFP and NIPS: request but will discuss with insurance prior to obtaining. Ultrasound discussed; fetal anatomic survey: ordered. Anticipatory guidance about prenatal visits given including labs, ultrasounds, and testing. Discussed usage of Babyscripts and virtual visits as additional source of managing and completing prenatal visits in midst of  coronavirus and pandemic.   Encouraged to complete MyChart Registration for her ability to review results, send requests, and have questions addressed.  The nature of [facility] with multiple MDs and Advanced Practice Providers was explained to patient; also emphasized that residents, students are part of our team. Routine obstetric precautions reviewed. Encouraged to seek out care at office or emergency room [facility] preferred) for urgent and/or emergent concerns. Return in about 4 weeks (around 06/12/2020) for LROB, in person.     Sharyon Cable, CNM [facility] [employer], [facility]

## 2020-05-17 LAB — URINE CULTURE, OB REFLEX: Organism ID, Bacteria: NO GROWTH

## 2020-05-17 LAB — CULTURE, OB URINE

## 2020-05-18 LAB — HEMOGLOBINOPATHY EVALUATION
Fetal Hemoglobin Testing: <1 % (ref 0.0–1.9)
HCT: 39.8 % (ref 35.0–45.0)
Hemoglobin A2 - HGBRFX: 3 % (ref 2.2–3.2)
Hemoglobin: 13.3 g/dL (ref 11.7–15.5)
Hgb A: 97 % (ref >96.0)
MCH: 29 pg (ref 27.0–33.0)
MCV: 86.9 fL (ref 80.0–100.0)
RBC: 4.58 10*6/uL (ref 3.80–5.10)
RDW: 12.2 % (ref 11.0–15.0)

## 2020-05-18 LAB — OBSTETRIC PANEL
Absolute Monocytes: 553 cells/uL (ref 200–950)
Antibody Screen: NOT DETECTED
Basophils Absolute: 32 cells/uL (ref 0–200)
Basophils Relative: 0.4 %
Eosinophils Absolute: 134 cells/uL (ref 15–500)
Eosinophils Relative: 1.7 %
HCT: 38.8 % (ref 35.0–45.0)
Hemoglobin: 13.1 g/dL (ref 11.7–15.5)
Hepatitis B Surface Ag: NONREACTIVE
Lymphs Abs: 1943 cells/uL (ref 850–3900)
MCH: 28.2 pg (ref 27.0–33.0)
MCHC: 33.8 g/dL (ref 32.0–36.0)
MCV: 83.6 fL (ref 80.0–100.0)
MPV: 10.9 fL (ref 7.5–12.5)
Monocytes Relative: 7 %
Neutro Abs: 5238 cells/uL (ref 1500–7800)
Neutrophils Relative %: 66.3 %
Platelets: 382 10*3/uL (ref 140–400)
RBC: 4.64 10*6/uL (ref 3.80–5.10)
RDW: 12.1 % (ref 11.0–15.0)
RPR Ser Ql: NONREACTIVE
Rubella: 13.1 Index
Total Lymphocyte: 24.6 %
WBC: 7.9 10*3/uL (ref 3.8–10.8)

## 2020-05-18 LAB — HEPATITIS C ANTIBODY
Hepatitis C Ab: NONREACTIVE
SIGNAL TO CUT-OFF: 0.01 (ref <1.00)

## 2020-05-18 LAB — CYTOLOGY - PAP
Chlamydia: NEGATIVE
Comment: NEGATIVE
Comment: NEGATIVE
Comment: NORMAL
Diagnosis: NEGATIVE
Neisseria Gonorrhea: NEGATIVE
Trichomonas: NEGATIVE

## 2020-05-18 LAB — HIV ANTIBODY (ROUTINE TESTING W REFLEX): HIV 1&2 Ab, 4th Generation: NONREACTIVE

## 2020-06-12 ENCOUNTER — Ambulatory Visit (INDEPENDENT_AMBULATORY_CARE_PROVIDER_SITE_OTHER): Payer: Managed Care, Other (non HMO) | Admitting: Certified Nurse Midwife

## 2020-06-12 ENCOUNTER — Other Ambulatory Visit: Payer: Self-pay

## 2020-06-12 ENCOUNTER — Encounter: Payer: Self-pay | Admitting: Certified Nurse Midwife

## 2020-06-12 VITALS — BP 109/74 | HR 71 | Wt 125.0 lb

## 2020-06-12 DIAGNOSIS — Z3402 Encounter for supervision of normal first pregnancy, second trimester: Secondary | ICD-10-CM

## 2020-06-12 DIAGNOSIS — Z3A14 14 weeks gestation of pregnancy: Secondary | ICD-10-CM

## 2020-06-12 NOTE — Progress Notes (Signed)
   PRENATAL VISIT NOTE  Subjective:  Joyce Crawford is a 30 y.o. G1P0 at [redacted]w[redacted]d being seen today for ongoing prenatal care.  She is currently monitored for the following issues for this low-risk pregnancy and has Supervision of normal first pregnancy on their problem list.  Patient reports no complaints.  Contractions: Not present. Vag. Bleeding: None.  Movement: Absent. Denies leaking of fluid.   The following portions of the patient's history were reviewed and updated as appropriate: allergies, current medications, past family history, past medical history, past social history, past surgical history and problem list.   Objective:   Vitals:   06/12/20 1515  BP: 109/74  Pulse: 71  Weight: 125 lb (56.7 kg)    Fetal Status: Fetal Heart Rate (bpm): 155   Movement: Absent     General:  Alert, oriented and cooperative. Patient is in no acute distress.  Skin: Skin is warm and dry. No rash noted.   Cardiovascular: Normal heart rate noted  Respiratory: Normal respiratory effort, no problems with respiration noted  Abdomen: Soft, gravid, appropriate for gestational age.  Pain/Pressure: Absent     Pelvic: Cervical exam deferred        Extremities: Normal range of motion.  Edema: None  Mental Status: Normal mood and affect. Normal behavior. Normal judgment and thought content.   Assessment and Plan:  Pregnancy: G1P0 at [redacted]w[redacted]d 1. Encounter for supervision of normal first pregnancy in second trimester - Patient doing well, no complaints or concerns at this time  - Routine prenatal care - Anticipatory guidance on upcoming appointments including anatomy US  - Educated and discussed fetal movement during pregnancy and when to expect fetal movement  - Patient reports nausea and constipation are getting better   2. [redacted] weeks gestation of pregnancy - Patient is going to talk to [place] about cost of genetic screening prior to getting   Preterm labor symptoms and general obstetric precautions  including but not limited to vaginal bleeding, contractions, leaking of fluid and fetal movement were reviewed in detail with the patient. Please refer to After Visit Summary for other counseling recommendations.   Return in about 4 weeks (around 07/10/2020) for LROB, in person,AFP.  Future Appointments  Date Time Provider Department Center  07/10/2020  3:10 PM Kendell Bane [facility]  07/17/2020  2:45 PM [facility]    Sharyon Cable, CNM

## 2020-06-12 NOTE — Patient Instructions (Signed)
Alpha-Fetoprotein Test Why am I having this test? The alpha-fetoprotein test is a lab test most commonly used for pregnant women to help screen for birth defects in their unborn baby. It can be used to screen for chromosome (DNA) abnormalities, problems with the brain or spinal cord, or problems with the abdominal wall of the unborn baby (fetus). The alpha-fetoprotein test may also be done for men or nonpregnant women to check for certain cancers. What is being tested? This test measures the amount of alpha-fetoprotein (AFP) in your blood. AFP is a protein that is made by the liver. Levels can be detected in the mother's blood during pregnancy, starting at 10 weeks and peaking at 16-18 weeks of the pregnancy. Abnormal levels can sometimes be a sign of a birth defect in the baby. Certain cancers can cause a high level of AFP in men and nonpregnant women. What kind of sample is taken? A blood sample is required for this test. It is usually collected by inserting a needle into a blood vessel.   How are the results reported? Your test results will be reported as values. Your health care provider will compare your results to normal ranges that were established after testing a large group of people (reference values). Reference values may vary among labs and hospitals. For this test, common reference values are:  Adult: Less than 40 ng/mL or less than 40 mcg/L (SI units).  Child younger than 1 year: Less than 30 ng/mL. If you are pregnant, the values may also vary based on how long you have been pregnant. What do the results mean? Results that are above the reference values in pregnant women may indicate the following for the baby:  Neural tube defects, such as abnormalities of the spinal cord or brain.  Abdominal wall defects.  Multiple pregnancy such as twins.  Fetal distress or fetal death. Results that are above the reference values in men or nonpregnant women may indicate:  Reproductive  cancers, such as ovarian or testicular cancer.  Liver cancer.  Liver cell death.  Other types of cancer. Very low levels of AFP in pregnant women may indicate Down syndrome for the baby. Talk with your health care provider about what your results mean. Questions to ask your health care provider Ask your health care provider, or the department that is doing the test:  When will my results be ready?  How will I get my results?  What are my treatment options?  What other tests do I need?  What are my next steps? Summary  The alpha-fetoprotein test is done on pregnant women to help screen for birth defects in their unborn baby.  Certain cancers can cause a high level of AFP in men and nonpregnant women.  For this test, a blood sample is usually collected by inserting a needle into a blood vessel.  Talk with your health care provider about what your results mean. This information is not intended to replace advice given to you by your health care provider. Make sure you discuss any questions you have with your health care provider. Document Revised: 11/11/2019 Document Reviewed: 11/11/2019 Elsevier Patient Education  2021 Elsevier Inc.  

## 2020-07-10 ENCOUNTER — Ambulatory Visit (INDEPENDENT_AMBULATORY_CARE_PROVIDER_SITE_OTHER): Payer: Managed Care, Other (non HMO) | Admitting: Advanced Practice Midwife

## 2020-07-10 ENCOUNTER — Other Ambulatory Visit: Payer: Self-pay

## 2020-07-10 VITALS — BP 112/73 | HR 75

## 2020-07-10 DIAGNOSIS — Z3A18 18 weeks gestation of pregnancy: Secondary | ICD-10-CM

## 2020-07-10 DIAGNOSIS — Z3402 Encounter for supervision of normal first pregnancy, second trimester: Secondary | ICD-10-CM

## 2020-07-10 NOTE — Patient Instructions (Addendum)
Second Trimester of Pregnancy  The second trimester of pregnancy is from week 13 through week 27. This is also called months 4 through 6 of pregnancy. This is often the time when you feel your best. During the second trimester:  Morning sickness is less or has stopped.  You may have more energy.  You may feel hungry more often. At this time, your unborn baby (fetus) is growing very fast. At the end of the sixth month, the unborn baby may be up to 12 inches long and weigh about 1 pounds. You will likely start to feel the baby move between 16 and 20 weeks of pregnancy. Body changes during your second trimester Your body continues to go through many changes during this time. The changes vary and generally return to normal after the baby is born. Physical changes  You will gain more weight.  You may start to get stretch marks on your hips, belly (abdomen), and breasts.  Your breasts will grow and may hurt.  Dark spots or blotches may develop on your face.  A dark line from your belly button to the pubic area (linea nigra) may appear.  You may have changes in your hair. Health changes  You may have headaches.  You may have heartburn.  You may have trouble pooping (constipation).  You may have hemorrhoids or swollen, bulging veins (varicose veins).  Your gums may bleed.  You may pee (urinate) more often.  You may have back pain. Follow these instructions at home: Medicines  Take over-the-counter and prescription medicines only as told by your doctor. Some medicines are not safe during pregnancy.  Take a prenatal vitamin that contains at least 600 micrograms (mcg) of folic acid. Eating and drinking  Eat healthy meals that include: ? Fresh fruits and vegetables. ? Whole grains. ? Good sources of protein, such as meat, eggs, or tofu. ? Low-fat dairy products.  Avoid raw meat and unpasteurized juice, milk, and cheese.  You may need to take these actions to prevent or  treat trouble pooping: ? Drink enough fluids to keep your pee (urine) pale yellow. ? Eat foods that are high in fiber. These include beans, whole grains, and fresh fruits and vegetables. ? Limit foods that are high in fat and sugar. These include fried or sweet foods. Activity  Exercise only as told by your doctor. Most people can do their usual exercise during pregnancy. Try to exercise for 30 minutes at least 5 days a week.  Stop exercising if you have pain or cramps in your belly or lower back.  Do not exercise if it is too hot or too humid, or if you are in a place of great height (high altitude).  Avoid heavy lifting.  If you choose to, you may have sex unless your doctor tells you not to. Relieving pain and discomfort  Wear a good support bra if your breasts are sore.  Take warm water baths (sitz baths) to soothe pain or discomfort caused by hemorrhoids. Use hemorrhoid cream if your doctor approves.  Rest with your legs raised (elevated) if you have leg cramps or low back pain.  If you develop bulging veins in your legs: ? Wear support hose as told by your doctor. ? Raise your feet for 15 minutes, 3-4 times a day. ? Limit salt in your food. Safety  Wear your seat belt at all times when you are in a car.  Talk with your doctor if someone is hurting you or yelling  at you a lot. Lifestyle  Do not use hot tubs, steam rooms, or saunas.  Do not douche. Do not use tampons or scented sanitary pads.  Avoid cat litter boxes and soil used by cats. These carry germs that can harm your baby and can cause a loss of your baby by miscarriage or stillbirth.  Do not use herbal medicines, illegal drugs, or medicines that are not approved by your doctor. Do not drink alcohol.  Do not smoke or use any products that contain nicotine or tobacco. If you need help quitting, ask your doctor. General instructions  Keep all follow-up visits. This is important.  Ask your doctor about local  prenatal classes.  Ask your doctor about the right foods to eat or for help finding a counselor. Where to find more information  American Pregnancy Association: americanpregnancy.org  [employer]: www.acog.org  Office on [employer] Health: MightyReward.co.nz Contact a doctor if:  You have a headache that does not go away when you take medicine.  You have changes in how you see, or you see spots in front of your eyes.  You have mild cramps, pressure, or pain in your lower belly.  You continue to feel like you may vomit (nauseous), you vomit, or you have watery poop (diarrhea).  You have bad-smelling fluid coming from your vagina.  You have pain when you pee or your pee smells bad.  You have very bad swelling of your face, hands, ankles, feet, or legs.  You have a fever. Get help right away if:  You are leaking fluid from your vagina.  You have spotting or bleeding from your vagina.  You have very bad belly cramping or pain.  You have trouble breathing.  You have chest pain.  You faint.  You have not felt your baby move for the time period told by your doctor.  You have new or increased pain, swelling, or redness in an arm or leg. Summary  The second trimester of pregnancy is from week 13 through week 27 (months 4 through 6).  Eat healthy meals.  Exercise as told by your doctor. Most people can do their usual exercise during pregnancy.  Do not use herbal medicines, illegal drugs, or medicines that are not approved by your doctor. Do not drink alcohol.  Call your doctor if you get sick or if you notice anything unusual about your pregnancy. This information is not intended to replace advice given to you by your health care provider. Make sure you discuss any questions you have with your health care provider. Document Revised: 09/28/2019 Document Reviewed: 08/04/2019 Elsevier Patient Education  2021 Elsevier  Inc.   Ba thng th? hai c?a [place] k? Second Trimester of Pregnancy  Ba thng th? hai c?a thai k? l t? tu?n 13 ??n tu?n 27. ?y l thng th? 4 ??n thng th? 6 c?a [place] k?Oley Balm th? hai th??ng l th?i gian m qu v? c?m th?y tho?i mi nh?t. C? th? c?a qu v? c?ng ? ?i?u ch?nh ?? ph h?p v?i vi?c mang thai v qu v? b?t ??u c?m th?y ?n h?n v? m?t th? ch?t. Trong ba thng th? hai:  ?m nghn ? gi?m ho?c h?t hon ton.  Qu v? c th? c nhi?u n?ng l??ng h?n.  Qu v? c th? t?ng c?m gic ngon mi?ng. Ba thng th? hai c?ng l th?i gian khi em b ch?a sinh (bo [place]) ?ang l?n ln nhanh chng. Vo cu?i thng th?  su, bo [place] c th? di kho?ng 12 in s? v n?ng kho?ng 1 pao. Qu v? c kh? n?ng s? b?t ??u c?m th?y em b c? ??ng (thai ??p l?n ??u) trong kho?n tu?n 16 ??n tu?n 20 c?a [place] k?. Cc thay ??i c? th? trong ba thng th? hai c?a [place] k? C? th? qu v? b?t ??u tr?i qua nhi?u thay ??i trong ba thng th? hai c?a [place] k?. Nh?ng thay ??i ny khc nhau v th??ng tr? l?i bnh th??ng sau khi sinh. Cc thay ??i v? th? ch?t  Cn n?ng c?a qu v? s? ti?p t?c t?ng. Qu v? s? nh?n th?y b?ng ph?ng to ra.  Qu v? c th? b?t ??u c cc v?t r?n da trn hng, b?ng v v.  Ng?c qu v? s? ti?p t?c pht tri?n v nh?y c?m ?au.  Cc ??m ho?c v?t t?i mu (rm m hay m?t n? [place] k?) c th? pht tri?n trn m?t qu v?.  M?t ???ng s?m mu ch?y t? r?n ??n vng lng mu (???ng linea nigra) c th? xu?t hi?n.  Qu v? c th? c nh?ng thay ??i v? tc. Nh?ng thay ??i ny c th? bao g?m tc dy ln, tc m?c nhanh h?n v thay ??i v? k?t c?u tc. M?t s? ng??i c?ng b? r?ng tc trong khi ho?c sau khi mang thai, ho?c c?m th?y tc kh ho?c m?ng. Cc thay ??i v? s?c kh?e  Qu v? c th? b? ?au ??u.  Qu v? c th? b? ? nng.  Qu v? c th? b? to bn.  Qu v? c th? b? tr? ho?c s?ng, phnh t?nh m?ch (gin t?nh m?ch).  N??u (l?i) c?a qu v? c th? ch?y mu v c th? nh?y c?m v?i vi?c ?nh r?ng v dng ch? nha khoa.  Qu  v? c th? ?i ti?u th??ng xuyn h?n v bo thai p vo bng quang c?a qu v?.  Quy? vi? c th? bi? ?au l?ng. V?n ?? ny l do: ? T?ng cn. ? Cc hc mn thai k? lm th? gin cc kh?p ? khung ch?u c?a qu v?. ? Thay ??i v? cn n?ng v cc c? h? tr? th?ng b?ng c?a qu v?. Tun th? nh?ng h??ng d?n ny ? nh: Thu?c  Tun th? cc ch? d?n c?a chuyn gia ch?m [place] s?c kh?e v? vi?c s? d?ng thu?c. M?t s? lo?i thu?c c? th? co? th? an ton ho?c khng an ton [place] dng trong qu trnh [place]. Khng dng b?t k? lo?i thu?c no tr? khi ???c chuyn gia ch?m [place] s?c kh?e c?a qu v? ch?p thu?n.  U?ng vitamin tr??c khi sinh c t nh?t 600 microgram (mcg) axit folic. ?n v u?ng  ?n ch? ?? ?n lnh m?nh bao g?m tri cy t??i v rau c?, ng? c?c nguyn cm, cc ngu?n protein t?t nh? th?t, tr?ng, ??u h? v cc s?n ph?m s?a t bo.  Trnh th?t s?ng v n??c p tri cy, s?a v pho mt ch?a ti?t trng. Nh?ng th?c ph?m ny mang m?m b?nh c th? c h?i cho qu v? v con qu v?.  Qu v? c th? c?n th?c hi?n cc hnh ??ng ny ?? ng?n ng?a ho?c ?i?u tr? to bn: ? U?ng ?? n??c ?? gi? cho n??c ti?u c mu vng nh?t. ? ?n th?c ?n giu ch?t x? nh? ??u, ng? c?c nguyn cm, tri cy t??i v rau. ? H?n ch? cc lo?i th?c ?n giu ch?t bo v ???ng tinh luy?n, ch?ng  h?n nh? ?? ?n chin/rn ho?c ?? ng?t. Ho?t ??ng  Ch? t?p th? d?c theo ch? d?n c?a chuyn gia ch?m [place] s?c kh?e. H?u h?t ph? n? ??u c th? ti?p t?c ch? ?? t?p luy?n bnh th??ng c?a h? trong khi mang [place]. C? g??ng t?p th? du?c 30 phu?t m?i nga?y, t nh?t 5 nga?y m?i tu?n. Ng?ng t?p th? d?c n?u qu v? c cc c?n co th?t trong t? cung.  D?ng t?p th? d?c n?u qu v? b? ?au ho?c co th?t ? b?ng d??i ho?c th?t l?ng.  Trnh t?p th? d?c n?u qu nng ho?c qu ?m, ho?c n?u qu v? ? n?i c ?? cao l?n.  Trnh nng v?t n?ng.  N?u qu v? ch?n, quy? vi? c th? quan h? tnh d?c tr? khi chuyn gia ch?m [place] s?c kh?e ni qu v? khng ???c quan h?. Gi?m ?au v gi?m c?m gic kh  ch?u  M?c m?t chi?c o ng?c nng ?? v ?? ng?n ng?a c?m gic kh ch?u do v b? nh?y c?m ?au.  T?m b?n ng?i n??c ?m ?? lm d?u ?au ho?c d?u c?m gic kh ch?u do b?nh tr? gy ra. S? d?ng kem ?i?u tr? b?nh tr? n?u chuyn gia ch?m [place] s?c kh?e ch?p thu?n.  Ngh? ng?i trong khi nng caochn c?a qu v? n?u qu v? b? chu?t rt chn ho?c ?au vng th?t l?ng.  N?u qu v? b? gin t?nh m?ch: ? ?i t?t h? tr? theo ch? d?n c?a chuyn gia ch?m [place] s?c kh?e. ? Nng cao chn c?a qu v? trong 15 pht, 3-4 l?n m?t ngy. ? H?n ch? mu?i trong ch? ?? ?n c?a qu v?. An ton  Lun th?t dy an ton khi li xe ho?c ng?i trn xe h?i.  Hy trao ??i v?i chuyn gia ch?m [place] s?c kh?e n?u ai ? l?ng m? qu v? b?ng l?i ni ho?c thn th?. L?i s?ng  Khng s? d?ng b?n t?m n??c nng, phng xng h?i, ho?c nh t?m h?i.  Khng th?t r?a. Khng s? d?ng nu?t b?ng v? sinh ho?c b?ng v? sinh c mi th?m.  Trnh h?p ?i v? sinh c?a mo v ??t v? sinh dnh cho mo. Nh?ng th?? na?y mang m?m b?nh c th? gy ra d? t?t b?m sinh ? tr? v c th? m?t [place] nhi do s?y [place] ho?c thai ch?t l?u.  Khng s? d?ng thu?c th?o d??c, r??u, ma ty b?t h?p php ho?c thu?c ch?a ???c chuyn gia ch?m [place] s?c kh?e c?a qu v? ch?p thu?n. Ha ch?t trong cc s?n ph?m ny c th? c h?i cho con qu v?.  Khng s? d?ng b?t k? s?n ph?m no c nicotine ho?c thu?c l, ch?ng ha?n nh? thu?c l d?ng ht, thu?c l ?i?n t? v thu?c l d?ng nhai. N?u qu v? c?n gip ?? ?? cai thu?c, hy h?i chuyn gia ch?m [place] s?c kh?e. H??ng d?n chung  Trong m?t l?n th?m khm tr??c khi sinh th??ng quy, chuyn gia ch?m [place] s?c kh?e s? ti?n hnh khm th?c th? v lm cc xt nghi?m khc. Ng??i ? c?ng s? th?o lu?n v? s?c kh?e t?ng th? c?a qu v?. Tun th? theo t?t c? cc l?n ??n khm l?i. ?i?u ny c vai tr quan tr?ng.  H?i chuyn gia ch?m [place] s?c kh?e c?a quy? vi? ?? ???c gi?i thi?u ??n m?t l?p h?c tr???c khi sinh t?i ??a ph??ng.  Yu c?u gip ?? n?u quy? vi? c nhu c?u t? v?n ho?c nhu  c?u dinh d??ng trong th??i gian mang [place]. Chuyn gia ch?m [place] s?c kh?e c?a quy? vi? c th? co? l?i khuyn ho?c gi?i thi?u quy? vi? ??n cc chuyn gia ?? ???c gip ?? v? ca?c nhu c?u khc nhau. N?i tm ki?m thm thng tin  American Pregnancy Association (Hi?p h?i Mang [place] M?): americanpregnancy.org  [employer] (Hi?p h?i S?n Ph? Amanda Cockaynekhoa Hoa K?): https://www.todd-brady.net/acog.org/en/Womens%20Health/Pregnancy  Office on [employer] (V?n phng S?c kho? Ph? n?): MightyReward.co.nzwomenshealth.gov/pregnancy Hy lin l?c v?i chuyn gia ch?m [place] s?c kh?e n?u qu v? c:  ?au ??u khng h?t sau khi dng thu?c.  Thay ??i th? l?c ho?c qu v? nhn th?y cc ??m ? pha tr??c m?t.  Co th?t nh? ? vng ch?u, t?c n?ng ? vng ch?u, ?au m ? ? vng b?ng.  Bu?n nn, nn m?a ho?c tiu ch?y dai d?ng.  Kh h? ? m ??o c mi hi ho?c n??c ti?u mi hi.  ?au khi qu v? ?i ti?u.  S?ng ??t ng?t ho?c r?t nhi?u ? m?t, bn tay, c? chn, bn chn ho?c chn.  S?t. Yu c?u tr? gip ngay l?p t?c n?u qu v?:  B? r? d?ch ? m ??o.  C ra ??m mu ho?c ch?y mu ? m ??o.  B? co th?t ho?c ?au r?t nhi?u ? b?ng.  Kh th?.  B? ?au ng?c.  B? ng?t x?u.  Khng c?m th?y em b c?? ??ng trong kho?ng th??i gian m chuyn gia ch?m [place] s??c kh?e ch? d?n.  B? ?au m?i ho?c ?au t?ng ln, s?ng ho?c t?y ?? ? m?t cnh tay ho?c chn. Tm t?t  Ba thng th? hai c?a [place] k? l t? tu?n 13 ??n tu?n 27 (thng th? 4 ??n th? thng th? 6).  Khng s? d?ng thu?c th?o d??c, r??u, ma ty b?t h?p php ho?c thu?c ch?a ???c chuyn gia ch?m [place] s?c kh?e c?a qu v? ch?p thu?n. Ha ch?t trong cc s?n ph?m ny c th? c h?i cho con qu v?.  Ch? t?p th? d?c theo ch? d?n c?a chuyn gia ch?m [place] s?c kh?e. H?u h?t ph? n? ??u c th? ti?p t?c ch? ?? t?p luy?n bnh th??ng c?a h? trong khi mang [place].  Tun th? theo t?t c? cc l?n ??n khm l?i. ?i?u ny c vai tr quan tr?ng. Thng tin ny khng nh?m m?c ?ch thay th? cho l?i khuyn m chuyn gia ch?m  [place] s?c kh?e ni v?i qu v?. Hy b?o ??m qu v? ph?i th?o lu?n b?t k? v?n ?? g m qu v? c v?i chuyn gia ch?m [place] s?c kh?e c?a qu v?. Document Revised: 10/06/2019 Document Reviewed: 10/06/2019 Elsevier Patient Education  2021 [employer]

## 2020-07-10 NOTE — Progress Notes (Signed)
Pt declines genetic testing.

## 2020-07-10 NOTE — Progress Notes (Signed)
   PRENATAL VISIT NOTE  Subjective:  Joyce Crawford is a 30 y.o. G1P0 at [redacted]w[redacted]d being seen today for ongoing prenatal care.  She is currently monitored for the following issues for this low-risk pregnancy and has Supervision of normal first pregnancy on their problem list.  Patient reports no complaints.  Contractions: Not present. Vag. Bleeding: None.  Movement: Present. Denies leaking of fluid.   The following portions of the patient's history were reviewed and updated as appropriate: allergies, current medications, past family history, past medical history, past social history, past surgical history and problem list.   Objective:   Vitals:   07/10/20 1516  BP: 112/73  Pulse: 75    Fetal Status: Fetal Heart Rate (bpm): 145   Movement: Present     General:  Alert, oriented and cooperative. Patient is in no acute distress.  Skin: Skin is warm and dry. No rash noted.   Cardiovascular: Normal heart rate noted  Respiratory: Normal respiratory effort, no problems with respiration noted  Abdomen: Soft, gravid, appropriate for gestational age.  Pain/Pressure: Absent     Pelvic: Cervical exam deferred        Extremities: Normal range of motion.  Edema: None  Mental Status: Normal mood and affect. Normal behavior. Normal judgment and thought content.   Assessment and Plan:  Pregnancy: G1P0 at [redacted]w[redacted]d 1. Encounter for supervision of normal first pregnancy in second trimester --Anticipatory guidance about next visits/weeks of pregnancy given. --Questions answered about safe sleeping positions/comfort while sleeping --Pt n/v and constipation are much better than in early pregnancy --Pt insurance did not cover NIPS testing so pt declined other options.  --Next visit in 4 weeks  2. [redacted] weeks gestation of pregnancy   Preterm labor symptoms and general obstetric precautions including but not limited to vaginal bleeding, contractions, leaking of fluid and fetal movement were reviewed in detail  with the patient. Please refer to After Visit Summary for other counseling recommendations.   Return in about 4 weeks (around 08/07/2020).  Future Appointments  Date Time Provider Department Center  07/17/2020  2:45 PM [facility]    Sharen Counter, CNM

## 2020-07-17 ENCOUNTER — Other Ambulatory Visit: Payer: Self-pay

## 2020-07-17 ENCOUNTER — Ambulatory Visit: Payer: Managed Care, Other (non HMO) | Attending: Certified Nurse Midwife

## 2020-07-17 DIAGNOSIS — Z3401 Encounter for supervision of normal first pregnancy, first trimester: Secondary | ICD-10-CM

## 2020-08-07 ENCOUNTER — Other Ambulatory Visit: Payer: Self-pay

## 2020-08-07 ENCOUNTER — Ambulatory Visit (INDEPENDENT_AMBULATORY_CARE_PROVIDER_SITE_OTHER): Payer: Managed Care, Other (non HMO) | Admitting: Advanced Practice Midwife

## 2020-08-07 VITALS — BP 107/71 | HR 77 | Wt 132.0 lb

## 2020-08-07 DIAGNOSIS — Z3A22 22 weeks gestation of pregnancy: Secondary | ICD-10-CM

## 2020-08-07 DIAGNOSIS — Z3402 Encounter for supervision of normal first pregnancy, second trimester: Secondary | ICD-10-CM

## 2020-08-07 NOTE — Patient Instructions (Signed)
Second Trimester of Pregnancy  The second trimester of pregnancy is from week 13 through week 27. This is months 4 through 6 of pregnancy. The second trimester is often a time when you feel your best. Your body has adjusted to being pregnant, and you begin to feel better physically. During the second trimester:  Morning sickness has lessened or stopped completely.  You may have more energy.  You may have an increase in appetite. The second trimester is also a time when the unborn baby (fetus) is growing rapidly. At the end of the sixth month, the fetus may be up to 12 inches long and weigh about 1 pounds. You will likely begin to feel the baby move (quickening) between 16 and 20 weeks of pregnancy. Body changes during your second trimester Your body continues to go through many changes during your second trimester. The changes vary and generally return to normal after the baby is born. Physical changes  Your weight will continue to increase. You will notice your lower abdomen bulging out.  You may begin to get stretch marks on your hips, abdomen, and breasts.  Your breasts will continue to grow and to become tender.  Dark spots or blotches (chloasma or mask of pregnancy) may develop on your face.  A dark line from your belly button to the pubic area (linea nigra) may appear.  You may have changes in your hair. These can include thickening of your hair, rapid growth, and changes in texture. Some people also have hair loss during or after pregnancy, or hair that feels dry or thin. Health changes  You may develop headaches.  You may have heartburn.  You may develop constipation.  You may develop hemorrhoids or swollen, bulging veins (varicose veins).  Your gums may bleed and may be sensitive to brushing and flossing.  You may urinate more often because the fetus is pressing on your bladder.  You may have back pain. This is caused by: ? Weight gain. ? Pregnancy hormones that  are relaxing the joints in your pelvis. ? A shift in weight and the muscles that support your balance. Follow these instructions at home: Medicines  Follow your health care provider's instructions regarding medicine use. Specific medicines may be either safe or unsafe to take during pregnancy. Do not take any medicines unless approved by your health care provider.  Take a prenatal vitamin that contains at least 600 micrograms (mcg) of folic acid. Eating and drinking  Eat a healthy diet that includes fresh fruits and vegetables, whole grains, good sources of protein such as meat, eggs, or tofu, and low-fat dairy products.  Avoid raw meat and unpasteurized juice, milk, and cheese. These carry germs that can harm you and your baby.  You may need to take these actions to prevent or treat constipation: ? Drink enough fluid to keep your urine pale yellow. ? Eat foods that are high in fiber, such as beans, whole grains, and fresh fruits and vegetables. ? Limit foods that are high in fat and processed sugars, such as fried or sweet foods. Activity  Exercise only as directed by your health care provider. Most people can continue their usual exercise routine during pregnancy. Try to exercise for 30 minutes at least 5 days a week. Stop exercising if you develop contractions in your uterus.  Stop exercising if you develop pain or cramping in the lower abdomen or lower back.  Avoid exercising if it is very hot or humid or if you are at   a high altitude.  Avoid heavy lifting.  If you choose to, you may have sex unless your health care provider tells you not to. Relieving pain and discomfort  Wear a supportive bra to prevent discomfort from breast tenderness.  Take warm sitz baths to soothe any pain or discomfort caused by hemorrhoids. Use hemorrhoid cream if your health care provider approves.  Rest with your legs raised (elevated) if you have leg cramps or low back pain.  If you develop  varicose veins: ? Wear support hose as told by your health care provider. ? Elevate your feet for 15 minutes, 3-4 times a day. ? Limit salt in your diet. Safety  Wear your seat belt at all times when driving or riding in a car.  Talk with your health care provider if someone is verbally or physically abusive to you. Lifestyle  Do not use hot tubs, steam rooms, or saunas.  Do not douche. Do not use tampons or scented sanitary pads.  Avoid cat litter boxes and soil used by cats. These carry germs that can cause birth defects in the baby and possibly loss of the fetus by miscarriage or stillbirth.  Do not use herbal remedies, alcohol, illegal drugs, or medicines that are not approved by your health care provider. Chemicals in these products can harm your baby.  Do not use any products that contain nicotine or tobacco, such as cigarettes, e-cigarettes, and chewing tobacco. If you need help quitting, ask your health care provider. General instructions  During a routine prenatal visit, your health care provider will do a physical exam and other tests. He or she will also discuss your overall health. Keep all follow-up visits. This is important.  Ask your health care provider for a referral to a local prenatal education class.  Ask for help if you have counseling or nutritional needs during pregnancy. Your health care provider can offer advice or refer you to specialists for help with various needs. Where to find more information  American Pregnancy Association: americanpregnancy.org  American College of Obstetricians and Gynecologists: acog.org/en/Womens%20Health/Pregnancy  Office on Women's Health: womenshealth.gov/pregnancy Contact a health care provider if you have:  A headache that does not go away when you take medicine.  Vision changes or you see spots in front of your eyes.  Mild pelvic cramps, pelvic pressure, or nagging pain in the abdominal area.  Persistent nausea,  vomiting, or diarrhea.  A bad-smelling vaginal discharge or foul-smelling urine.  Pain when you urinate.  Sudden or extreme swelling of your face, hands, ankles, feet, or legs.  A fever. Get help right away if you:  Have fluid leaking from your vagina.  Have spotting or bleeding from your vagina.  Have severe abdominal cramping or pain.  Have difficulty breathing.  Have chest pain.  Have fainting spells.  Have not felt your baby move for the time period told by your health care provider.  Have new or increased pain, swelling, or redness in an arm or leg. Summary  The second trimester of pregnancy is from week 13 through week 27 (months 4 through 6).  Do not use herbal remedies, alcohol, illegal drugs, or medicines that are not approved by your health care provider. Chemicals in these products can harm your baby.  Exercise only as directed by your health care provider. Most people can continue their usual exercise routine during pregnancy.  Keep all follow-up visits. This is important. This information is not intended to replace advice given to you by your   health care provider. Make sure you discuss any questions you have with your health care provider. Document Revised: 09/28/2019 Document Reviewed: 08/04/2019 Elsevier Patient Education  2021 Elsevier Inc.   Ba thng th? hai c?a [place] k? Second Trimester of Pregnancy  Ba thng th? hai c?a thai k? l t? tu?n 13 ??n tu?n 27. ?y l thng th? 4 ??n thng th? 6 c?a [place] k?Oley Balm th? hai th??ng l th?i gian m qu v? c?m th?y tho?i mi nh?t. C? th? c?a qu v? c?ng ? ?i?u ch?nh ?? ph h?p v?i vi?c mang thai v qu v? b?t ??u c?m th?y ?n h?n v? m?t th? ch?t. Trong ba thng th? hai:  ?m nghn ? gi?m ho?c h?t hon ton.  Qu v? c th? c nhi?u n?ng l??ng h?n.  Qu v? c th? t?ng c?m gic ngon mi?ng. Ba thng th? hai c?ng l th?i gian khi em b ch?a sinh (bo [place]) ?ang l?n ln nhanh chng. Vo cu?i thng th? su, bo  [place] c th? di kho?ng 12 in s? v n?ng kho?ng 1 pao. Qu v? c kh? n?ng s? b?t ??u c?m th?y em b c? ??ng (thai ??p l?n ??u) trong kho?n tu?n 16 ??n tu?n 20 c?a [place] k?. Cc thay ??i c? th? trong ba thng th? hai c?a [place] k? C? th? qu v? b?t ??u tr?i qua nhi?u thay ??i trong ba thng th? hai c?a [place] k?. Nh?ng thay ??i ny khc nhau v th??ng tr? l?i bnh th??ng sau khi sinh. Cc thay ??i v? th? ch?t  Cn n?ng c?a qu v? s? ti?p t?c t?ng. Qu v? s? nh?n th?y b?ng ph?ng to ra.  Qu v? c th? b?t ??u c cc v?t r?n da trn hng, b?ng v v.  Ng?c qu v? s? ti?p t?c pht tri?n v nh?y c?m ?au.  Cc ??m ho?c v?t t?i mu (rm m hay m?t n? [place] k?) c th? pht tri?n trn m?t qu v?.  M?t ???ng s?m mu ch?y t? r?n ??n vng lng mu (???ng linea nigra) c th? xu?t hi?n.  Qu v? c th? c nh?ng thay ??i v? tc. Nh?ng thay ??i ny c th? bao g?m tc dy ln, tc m?c nhanh h?n v thay ??i v? k?t c?u tc. M?t s? ng??i c?ng b? r?ng tc trong khi ho?c sau khi mang thai, ho?c c?m th?y tc kh ho?c m?ng. Cc thay ??i v? s?c kh?e  Qu v? c th? b? ?au ??u.  Qu v? c th? b? ? nng.  Qu v? c th? b? to bn.  Qu v? c th? b? tr? ho?c s?ng, phnh t?nh m?ch (gin t?nh m?ch).  N??u (l?i) c?a qu v? c th? ch?y mu v c th? nh?y c?m v?i vi?c ?nh r?ng v dng ch? nha khoa.  Qu v? c th? ?i ti?u th??ng xuyn h?n v bo thai p vo bng quang c?a qu v?.  Quy? vi? c th? bi? ?au l?ng. V?n ?? ny l do: ? T?ng cn. ? Cc hc mn thai k? lm th? gin cc kh?p ? khung ch?u c?a qu v?. ? Thay ??i v? cn n?ng v cc c? h? tr? th?ng b?ng c?a qu v?. Tun th? nh?ng h??ng d?n ny ? nh: Thu?c  Tun th? cc ch? d?n c?a chuyn gia ch?m [place] s?c kh?e v? vi?c s? d?ng thu?c. M?t s? lo?i thu?c c? th? co? th? an ton ho?c khng an ton [place] dng trong qu trnh [place]. Khng  dng b?t k? lo?i thu?c no tr? khi ???c chuyn gia ch?m [place] s?c kh?e c?a qu v? ch?p thu?n.  U?ng vitamin tr??c khi sinh c t nh?t  600 microgram (mcg) axit folic. ?n v u?ng  ?n ch? ?? ?n lnh m?nh bao g?m tri cy t??i v rau c?, ng? c?c nguyn cm, cc ngu?n protein t?t nh? th?t, tr?ng, ??u h? v cc s?n ph?m s?a t bo.  Trnh th?t s?ng v n??c p tri cy, s?a v pho mt ch?a ti?t trng. Nh?ng th?c ph?m ny mang m?m b?nh c th? c h?i cho qu v? v con qu v?.  Qu v? c th? c?n th?c hi?n cc hnh ??ng ny ?? ng?n ng?a ho?c ?i?u tr? to bn: ? U?ng ?? n??c ?? gi? cho n??c ti?u c mu vng nh?t. ? ?n th?c ?n giu ch?t x? nh? ??u, ng? c?c nguyn cm, tri cy t??i v rau. ? H?n ch? cc lo?i th?c ?n giu ch?t bo v ???ng tinh luy?n, ch?ng h?n nh? ?? ?n chin/rn ho?c ?? ng?t. Ho?t ??ng  Ch? t?p th? d?c theo ch? d?n c?a chuyn gia ch?m [place] s?c kh?e. H?u h?t ph? n? ??u c th? ti?p t?c ch? ?? t?p luy?n bnh th??ng c?a h? trong khi mang [place]. C? g??ng t?p th? du?c 30 phu?t m?i nga?y, t nh?t 5 nga?y m?i tu?n. Ng?ng t?p th? d?c n?u qu v? c cc c?n co th?t trong t? cung.  D?ng t?p th? d?c n?u qu v? b? ?au ho?c co th?t ? b?ng d??i ho?c th?t l?ng.  Trnh t?p th? d?c n?u qu nng ho?c qu ?m, ho?c n?u qu v? ? n?i c ?? cao l?n.  Trnh nng v?t n?ng.  N?u qu v? ch?n, quy? vi? c th? quan h? tnh d?c tr? khi chuyn gia ch?m [place] s?c kh?e ni qu v? khng ???c quan h?. Gi?m ?au v gi?m c?m gic kh ch?u  M?c m?t chi?c o ng?c nng ?? v ?? ng?n ng?a c?m gic kh ch?u do v b? nh?y c?m ?au.  T?m b?n ng?i n??c ?m ?? lm d?u ?au ho?c d?u c?m gic kh ch?u do b?nh tr? gy ra. S? d?ng kem ?i?u tr? b?nh tr? n?u chuyn gia ch?m [place] s?c kh?e ch?p thu?n.  Ngh? ng?i trong khi nng caochn c?a qu v? n?u qu v? b? chu?t rt chn ho?c ?au vng th?t l?ng.  N?u qu v? b? gin t?nh m?ch: ? ?i t?t h? tr? theo ch? d?n c?a chuyn gia ch?m [place] s?c kh?e. ? Nng cao chn c?a qu v? trong 15 pht, 3-4 l?n m?t ngy. ? H?n ch? mu?i trong ch? ?? ?n c?a qu v?. An ton  Lun th?t dy an ton khi li xe ho?c ng?i trn xe h?i.  Hy  trao ??i v?i chuyn gia ch?m [place] s?c kh?e n?u ai ? l?ng m? qu v? b?ng l?i ni ho?c thn th?. L?i s?ng  Khng s? d?ng b?n t?m n??c nng, phng xng h?i, ho?c nh t?m h?i.  Khng th?t r?a. Khng s? d?ng nu?t b?ng v? sinh ho?c b?ng v? sinh c mi th?m.  Trnh h?p ?i v? sinh c?a mo v ??t v? sinh dnh cho mo. Nh?ng th?? na?y mang m?m b?nh c th? gy ra d? t?t b?m sinh ? tr? v c th? m?t [place] nhi do s?y [place] ho?c thai ch?t l?u.  Khng s? d?ng thu?c th?o d??c, r??u, ma ty b?t h?p php ho?c thu?c ch?a ???c chuyn gia ch?m   s?c kh?e c?a qu v? ch?p thu?n. Ha ch?t trong cc s?n ph?m ny c th? c h?i cho con qu v?.  Khng s? d?ng b?t k? s?n ph?m no c nicotine ho?c thu?c l, ch?ng ha?n nh? thu?c l d?ng ht, thu?c l ?i?n t? v thu?c l d?ng nhai. N?u qu v? c?n gip ?? ?? cai thu?c, hy h?i chuyn gia ch?m sc s?c kh?e. H??ng d?n chung  Trong m?t l?n th?m khm tr??c khi sinh th??ng quy, chuyn gia ch?m sc s?c kh?e s? ti?n hnh khm th?c th? v lm cc xt nghi?m khc. Ng??i ? c?ng s? th?o lu?n v? s?c kh?e t?ng th? c?a qu v?. Tun th? theo t?t c? cc l?n ??n khm l?i. ?i?u ny c vai tr quan tr?ng.  H?i chuyn gia ch?m sc s?c kh?e c?a quy? vi? ?? ???c gi?i thi?u ??n m?t l?p h?c tr???c khi sinh t?i ??a ph??ng.  Yu c?u gip ?? n?u quy? vi? c nhu c?u t? v?n ho?c nhu c?u dinh d??ng trong th??i gian mang thai. Chuyn gia ch?m sc s?c kh?e c?a quy? vi? c th? co? l?i khuyn ho?c gi?i thi?u quy? vi? ??n cc chuyn gia ?? ???c gip ?? v? ca?c nhu c?u khc nhau. N?i tm ki?m thm thng tin  American Pregnancy Association (Hi?p h?i Mang thai M?): americanpregnancy.org  American College of Obstetricians and Gynecologists (Hi?p h?i S?n Ph? khoa Hoa K?): acog.org/en/Womens%20Health/Pregnancy  Office on Women's Health (V?n phng S?c kho? Ph? n?): womenshealth.gov/pregnancy Hy lin l?c v?i chuyn gia ch?m sc s?c kh?e n?u qu v? c:  ?au ??u khng h?t sau khi dng thu?c.  Thay ??i th?  l?c ho?c qu v? nhn th?y cc ??m ? pha tr??c m?t.  Co th?t nh? ? vng ch?u, t?c n?ng ? vng ch?u, ?au m ? ? vng b?ng.  Bu?n nn, nn m?a ho?c tiu ch?y dai d?ng.  Kh h? ? m ??o c mi hi ho?c n??c ti?u mi hi.  ?au khi qu v? ?i ti?u.  S?ng ??t ng?t ho?c r?t nhi?u ? m?t, bn tay, c? chn, bn chn ho?c chn.  S?t. Yu c?u tr? gip ngay l?p t?c n?u qu v?:  B? r? d?ch ? m ??o.  C ra ??m mu ho?c ch?y mu ? m ??o.  B? co th?t ho?c ?au r?t nhi?u ? b?ng.  Kh th?.  B? ?au ng?c.  B? ng?t x?u.  Khng c?m th?y em b c?? ??ng trong kho?ng th??i gian m chuyn gia ch?m sc s??c kh?e ch? d?n.  B? ?au m?i ho?c ?au t?ng ln, s?ng ho?c t?y ?? ? m?t cnh tay ho?c chn. Tm t?t  Ba thng th? hai c?a thai k? l t? tu?n 13 ??n tu?n 27 (thng th? 4 ??n th? thng th? 6).  Khng s? d?ng thu?c th?o d??c, r??u, ma ty b?t h?p php ho?c thu?c ch?a ???c chuyn gia ch?m sc s?c kh?e c?a qu v? ch?p thu?n. Ha ch?t trong cc s?n ph?m ny c th? c h?i cho con qu v?.  Ch? t?p th? d?c theo ch? d?n c?a chuyn gia ch?m sc s?c kh?e. H?u h?t ph? n? ??u c th? ti?p t?c ch? ?? t?p luy?n bnh th??ng c?a h? trong khi mang thai.  Tun th? theo t?t c? cc l?n ??n khm l?i. ?i?u ny c vai tr quan tr?ng. Thng tin ny khng nh?m m?c ?ch thay th? cho l?i khuyn m chuyn gia ch?m sc s?c kh?e ni v?i qu v?. Hy b?o ??m   qu v? ph?i th?o lu?n b?t k? v?n ?? g m qu v? c v?i chuyn gia ch?m [place] s?c kh?e c?a qu v?. Document Revised: 10/06/2019 Document Reviewed: 10/06/2019 Elsevier Patient Education  2021 [employer].

## 2020-08-07 NOTE — Progress Notes (Signed)
   PRENATAL VISIT NOTE  Subjective:  Joyce Crawford is a 30 y.o. G1P0 at 100w5d being seen today for ongoing prenatal care.  She is currently monitored for the following issues for this low-risk pregnancy and has Supervision of normal first pregnancy on their problem list.  Patient reports no complaints.  Contractions: Not present. Vag. Bleeding: None.  Movement: Present. Denies leaking of fluid.   The following portions of the patient's history were reviewed and updated as appropriate: allergies, current medications, past family history, past medical history, past social history, past surgical history and problem list.   Objective:   Vitals:   08/07/20 1534  BP: 107/71  Pulse: 77  Weight: 132 lb (59.9 kg)    Fetal Status: Fetal Heart Rate (bpm): 150 Fundal Height: 22 cm Movement: Present     General:  Alert, oriented and cooperative. Patient is in no acute distress.  Skin: Skin is warm and dry. No rash noted.   Cardiovascular: Normal heart rate noted  Respiratory: Normal respiratory effort, no problems with respiration noted  Abdomen: Soft, gravid, appropriate for gestational age.  Pain/Pressure: Present     Pelvic: Cervical exam deferred        Extremities: Normal range of motion.  Edema: None  Mental Status: Normal mood and affect. Normal behavior. Normal judgment and thought content.   Assessment and Plan:  Pregnancy: G1P0 at [redacted]w[redacted]d 1. Encounter for supervision of normal first pregnancy in second trimester --Anticipatory guidance about next visits/weeks of pregnancy given. --Answered questions about weight gain and diet in pregnancy --Reviewed normal anatomy US results --Next visit in 5 weeks for GTT  2. [redacted] weeks gestation of pregnancy   Preterm labor symptoms and general obstetric precautions including but not limited to vaginal bleeding, contractions, leaking of fluid and fetal movement were reviewed in detail with the patient. Please refer to After Visit Summary for other  counseling recommendations.   Return in about 5 weeks (around 09/11/2020).  Future Appointments  Date Time Provider Department Center  09/11/2020  9:10 AM Brand Males, CNM [facility]    Sharen Counter, CNM

## 2020-09-11 ENCOUNTER — Ambulatory Visit (INDEPENDENT_AMBULATORY_CARE_PROVIDER_SITE_OTHER): Payer: Managed Care, Other (non HMO)

## 2020-09-11 ENCOUNTER — Other Ambulatory Visit: Payer: Self-pay

## 2020-09-11 VITALS — BP 111/83 | HR 85 | Wt 137.0 lb

## 2020-09-11 DIAGNOSIS — Z3402 Encounter for supervision of normal first pregnancy, second trimester: Secondary | ICD-10-CM

## 2020-09-11 DIAGNOSIS — Z3A27 27 weeks gestation of pregnancy: Secondary | ICD-10-CM

## 2020-09-11 NOTE — Progress Notes (Signed)
PHQ 9: 2 GAD-7: 2

## 2020-09-11 NOTE — Progress Notes (Signed)
   PRENATAL VISIT NOTE  Subjective:  Joyce Crawford is a 30 y.o. G1P0 at [redacted]w[redacted]d being seen today for ongoing prenatal care.  She is currently monitored for the following issues for this low-risk pregnancy and has Supervision of normal first pregnancy on their problem list.  Patient reports intermittent mid back pain. Pain is worse with standing and resolves with lying down. She denies contractions, vaginal bleeding, LOF, or urinary s/s. Endorses active fetal movement. Contractions: Not present. Vag. Bleeding: None.  Movement: Present. Denies leaking of fluid.   The following portions of the patient's history were reviewed and updated as appropriate: allergies, current medications, past family history, past medical history, past social history, past surgical history and problem list.   Objective:   Vitals:   09/11/20 0916  BP: 111/83  Pulse: 85  Weight: 137 lb (62.1 kg)    Fetal Status: Fetal Heart Rate (bpm): 149 Fundal Height: 26 cm Movement: Present     General:  Alert, oriented and cooperative. Patient is in no acute distress.  Skin: Skin is warm and dry. No rash noted.   Cardiovascular: Normal heart rate noted  Respiratory: Normal respiratory effort, no problems with respiration noted  Abdomen: Soft, gravid, appropriate for gestational age.        Pelvic: Cervical exam deferred        Extremities: Normal range of motion.     Mental Status: Normal mood and affect. Normal behavior. Normal judgment and thought content.   Assessment and Plan:  Pregnancy: G1P0 at [redacted]w[redacted]d  1. Encounter for supervision of normal first pregnancy in second trimester - Reports intermittent mid back pain, resolves with rest. No other symptoms. Recommend support belt, heat/ice prn, Tylenol prn if pain does not resolve with rest. Reviewed warning signs/symptoms. - Routine care  - Anticipatory guidance for upcoming appointments    - 2Hr GTT w/ 1 Hr Carpenter 75 g - HIV antibody (with reflex) - CBC -  RPR   2. [redacted] weeks gestation of pregnancy    Preterm labor symptoms and general obstetric precautions including but not limited to vaginal bleeding, contractions, leaking of fluid and fetal movement were reviewed in detail with the patient. Please refer to After Visit Summary for other counseling recommendations.   Return in about 2 weeks (around 09/25/2020).  Future Appointments  Date Time Provider Department Center  09/25/2020  2:50 PM Rasch, Harolyn Rutherford, NP [facility]    Brand Males, CNM 09/11/20 9:52 AM

## 2020-09-11 NOTE — Patient Instructions (Signed)

## 2020-09-11 NOTE — Progress Notes (Signed)
Pt c/o back pain

## 2020-09-12 LAB — CBC
HCT: 35 % (ref 35.0–45.0)
Hemoglobin: 11.7 g/dL (ref 11.7–15.5)
MCH: 28.9 pg (ref 27.0–33.0)
MCHC: 33.4 g/dL (ref 32.0–36.0)
MCV: 86.4 fL (ref 80.0–100.0)
MPV: 10.6 fL (ref 7.5–12.5)
Platelets: 278 10*3/uL (ref 140–400)
RBC: 4.05 10*6/uL (ref 3.80–5.10)
RDW: 12.3 % (ref 11.0–15.0)
WBC: 9.2 10*3/uL (ref 3.8–10.8)

## 2020-09-12 LAB — 2HR GTT W 1 HR, CARPENTER, 75 G
Glucose, 1 Hr, Gest: 143 mg/dL (ref 65–179)
Glucose, 2 Hr, Gest: 115 mg/dL (ref 65–152)
Glucose, Fasting, Gest: 72 mg/dL (ref 65–91)

## 2020-09-12 LAB — RPR: RPR Ser Ql: NONREACTIVE

## 2020-09-12 LAB — HIV ANTIBODY (ROUTINE TESTING W REFLEX): HIV 1&2 Ab, 4th Generation: NONREACTIVE

## 2020-09-25 ENCOUNTER — Ambulatory Visit (INDEPENDENT_AMBULATORY_CARE_PROVIDER_SITE_OTHER): Payer: Managed Care, Other (non HMO) | Admitting: Obstetrics and Gynecology

## 2020-09-25 ENCOUNTER — Other Ambulatory Visit: Payer: Self-pay

## 2020-09-25 VITALS — BP 114/75 | HR 96 | Wt 141.0 lb

## 2020-09-25 DIAGNOSIS — Z23 Encounter for immunization: Secondary | ICD-10-CM | POA: Diagnosis not present

## 2020-09-25 DIAGNOSIS — Z8679 Personal history of other diseases of the circulatory system: Secondary | ICD-10-CM

## 2020-09-25 DIAGNOSIS — Z3402 Encounter for supervision of normal first pregnancy, second trimester: Secondary | ICD-10-CM

## 2020-09-25 NOTE — Progress Notes (Signed)
   PRENATAL VISIT NOTE  Subjective:  Joyce Crawford is a 30 y.o. G1P0 at [redacted]w[redacted]d being seen today for ongoing prenatal care.  She is currently monitored for the following issues for this high-risk pregnancy and has Supervision of normal first pregnancy on their problem list.  Patient reports no complaints.  Contractions: Irritability. Vag. Bleeding: None.  Movement: Present. Denies leaking of fluid.   The following portions of the patient's history were reviewed and updated as appropriate: allergies, current medications, past family history, past medical history, past social history, past surgical history and problem list.   Objective:   Vitals:   09/25/20 1510  BP: 114/75  Pulse: 96  Weight: 141 lb (64 kg)    Fetal Status: Fetal Heart Rate (bpm): 153 Fundal Height: 28 cm Movement: Present     General:  Alert, oriented and cooperative. Patient is in no acute distress.  Skin: Skin is warm and dry. No rash noted.   Cardiovascular: Normal heart rate noted  Respiratory: Normal respiratory effort, no problems with respiration noted  Abdomen: Soft, gravid, appropriate for gestational age.  Pain/Pressure: Present     Pelvic: Cervical exam deferred        Extremities: Normal range of motion.  Edema: None  Mental Status: Normal mood and affect. Normal behavior. Normal judgment and thought content.   Assessment and Plan:  Pregnancy: G1P0 at [redacted]w[redacted]d 1. Encounter for supervision of normal first pregnancy in second trimester  Doing well, has questions regarding delivery.  Has a heart condition that was diagnosed in [place]. She is unsure of the name; she thinks it is aortic stenosis. Has never had surgery on her heart. However is concerned her heart condition will not allow her to have a vaginal delivery.  2. History of aortic stenosis  - Ambulatory referral to Cardiology  Preterm labor symptoms and general obstetric precautions including but not limited to vaginal bleeding, contractions,  leaking of fluid and fetal movement were reviewed in detail with the patient. Please refer to After Visit Summary for other counseling recommendations.   Return in about 2 weeks (around 10/09/2020), or MD visit/ aortic stenosis.  Future Appointments  Date Time Provider Department Center  10/11/2020  2:15 PM Conan Bowens, MD [facility]    Venia Carbon, NP

## 2020-10-05 ENCOUNTER — Ambulatory Visit: Payer: Managed Care, Other (non HMO) | Admitting: Internal Medicine

## 2020-10-05 ENCOUNTER — Encounter: Payer: Self-pay | Admitting: Internal Medicine

## 2020-10-05 ENCOUNTER — Other Ambulatory Visit: Payer: Self-pay

## 2020-10-05 VITALS — BP 102/72 | HR 81 | Ht 60.5 in | Wt 143.0 lb

## 2020-10-05 DIAGNOSIS — R011 Cardiac murmur, unspecified: Secondary | ICD-10-CM | POA: Diagnosis not present

## 2020-10-05 NOTE — Patient Instructions (Signed)
Medication Instructions:  No changes  Lab Work: none  Testing/Procedures: Your physician has requested that you have an echocardiogram. Echocardiography is a painless test that uses sound waves to create images of your heart. It provides your doctor with information about the size and shape of your heart and how well your heart's chambers and valves are working. This procedure takes approximately one hour. There are no restrictions for this procedure.   Follow-Up: At [facility], you and your health needs are our priority.  As part of our continuing mission to provide you with exceptional heart care, we have created designated Provider Care Teams.  These Care Teams include your primary Cardiologist (physician) and Advanced Practice Providers (APPs -  Physician Assistants and Nurse Practitioners) who all work together to provide you with the care you need, when you need it.  We recommend signing up for the patient portal called "MyChart".  Sign up information is provided on this After Visit Summary.  MyChart is used to connect with patients for Virtual Visits (Telemedicine).  Patients are able to view lab/test results, encounter notes, upcoming appointments, etc.  Non-urgent messages can be sent to your provider as well.   To learn more about what you can do with MyChart, go to [email or web address].    Follow up as needed with Dr. Tenny Craw  Other Instructions

## 2020-10-05 NOTE — Progress Notes (Signed)
Cardiology Office Note   Date:  10/05/2020   ID:  Joyce Crawford, DOB Sep 30, 1990, MRN [id number]  PCP:  Sunnie Nielsen, DO  Cardiologist:   Dietrich Pates, MD   Patient referred from [facility] clinic for evaluation of possible aortic stenosis    History of Present Illness: Joyce Crawford is a 30 y.o. female G1P0 who is [redacted] wks pregnant   Referred to cardiology for evaluation of reported aortic stenosis   The pt is here today with her husband   She speaks [place] though not great.   She saw at ag 10 and then 14 she had evaluation for murmur in [place]   Had an echo in 2017   Per her report told OK   (? Mitral regurg, ? tricusp regurg).   Her husband thinks that with translator it was misunderstood at [facility] office recently that she had AS  The pt says she felt tired at times before getting pregnant.   ALso says at times her heart beats fast. Prior to getting pregnant worked in [employer]   Would get SOB if busy     The pt says the patient has been anxious at times    Has had hyperventilation before which may be exacerbating symtpoms     Current Meds  Medication Sig  . ondansetron (ZOFRAN-ODT) 8 MG disintegrating tablet Take 1 tablet (8 mg total) by mouth every 8 (eight) hours as needed for nausea.  . Prenatal Vit-Fe Fumarate-FA (PRENATAL VITAMIN PO) Take by mouth.     Allergies:   Patient has no known allergies.   No past medical history on file.  No past surgical history on file.   Social History:  The patient  reports that she has never smoked. She has never used smokeless tobacco. She reports that she does not drink alcohol and does not use drugs.   Family History:  The patient's family history includes Hypertension in her mother.    ROS:  Please see the history of present illness. All other systems are reviewed and  Negative to the above problem except as noted.    PHYSICAL EXAM: VS:  BP 102/72   Pulse 81   Ht 5' 0.5" (1.537 m)   Wt 143 lb (64.9 kg)   LMP 03/01/2020 Comment: per  patient  SpO2 98%   BMI 27.47 kg/m   GEN: Well nourished, well developed, in no acute distress  HEENT: normal  Neck: no JVD, carotid bruits Cardiac: RRR; Gr I/VI systolic murmur LSB Equal onsue t of pulses throughout    No LE edema  Respiratory:  clear to auscultation bilaterally, normal work of breathing GI: No hepatomegaly  Abd distended   MS: no deformity Moving all extremities   Skin: warm and dry, no rash Neuro:  Strength and sensation are intact Psych: euthymic mood, full affect   EKG:  EKG is ordered today.  SR   81 bpm   Nonspecif T wve changes with T wave inversion III, V1, V3,V4.     Lipid Panel No results found for: CHOL, TRIG, HDL, CHOLHDL, VLDL, LDLCALC, LDLDIRECT    Wt Readings from Last 3 Encounters:  10/05/20 143 lb (64.9 kg)  09/25/20 141 lb (64 kg)  09/11/20 137 lb (62.1 kg)      ASSESSMENT AND PLAN: Pt is a 30 yo now [redacted] wks pregnant in first pregnancy   Referred for cardiac eval   Had echo in past  Told OK ON exam today she has soft systolic murmur  SLB   Otherwise cardiac exam is unremarkable    I do not think she has aortic stenosis WIll get an echo to confirm valvular function   Overall though I think she should do well from a cardiac standpoint with pregnancy    I have recomm she stay active   Hydrate  She has had intermitt SOB in past   I am not convinced of cardiac problems to explain    Will review echo Has had palpitations in past, nothing that sounds like an arrhythmia.   F/U based on test results.       Current medicines are reviewed at length with the patient today.  The patient does not have concerns regarding medicines.  Signed, Dietrich Pates, MD  10/05/2020 8:11 AM    [facility] [address] [place], [place], [place]  [address] Phone: [phone]; Fax: [phone]

## 2020-10-11 ENCOUNTER — Ambulatory Visit (INDEPENDENT_AMBULATORY_CARE_PROVIDER_SITE_OTHER): Payer: Managed Care, Other (non HMO) | Admitting: Obstetrics and Gynecology

## 2020-10-11 ENCOUNTER — Other Ambulatory Visit: Payer: Self-pay

## 2020-10-11 ENCOUNTER — Encounter: Payer: Self-pay | Admitting: Obstetrics and Gynecology

## 2020-10-11 VITALS — BP 117/77 | HR 117 | Wt 144.0 lb

## 2020-10-11 DIAGNOSIS — Z789 Other specified health status: Secondary | ICD-10-CM

## 2020-10-11 DIAGNOSIS — Z3402 Encounter for supervision of normal first pregnancy, second trimester: Secondary | ICD-10-CM

## 2020-10-11 DIAGNOSIS — I519 Heart disease, unspecified: Secondary | ICD-10-CM | POA: Insufficient documentation

## 2020-10-11 DIAGNOSIS — Z3A32 32 weeks gestation of pregnancy: Secondary | ICD-10-CM

## 2020-10-11 NOTE — Progress Notes (Signed)
   PRENATAL VISIT NOTE  Subjective:  Joyce Crawford is a 30 y.o. G1P0 at [redacted]w[redacted]d being seen today for ongoing prenatal care.  She is currently monitored for the following issues for this high-risk pregnancy and has Supervision of normal first pregnancy and Heart problem on their problem list.  Patient reports occasional contractions.  Contractions: Irritability. Vag. Bleeding: None.  Movement: Present. Denies leaking of fluid.   The following portions of the patient's history were reviewed and updated as appropriate: allergies, current medications, past family history, past medical history, past social history, past surgical history and problem list.   Objective:   Vitals:   10/11/20 1413  BP: 117/77  Pulse: (!) 117  Weight: 144 lb (65.3 kg)    Fetal Status: Fetal Heart Rate (bpm): 148 Fundal Height: 31 cm Movement: Present     General:  Alert, oriented and cooperative. Patient is in no acute distress.  Skin: Skin is warm and dry. No rash noted.   Cardiovascular: Normal heart rate noted  Respiratory: Normal respiratory effort, no problems with respiration noted  Abdomen: Soft, gravid, appropriate for gestational age.  Pain/Pressure: Present     Pelvic: Cervical exam deferred        Extremities: Normal range of motion.  Edema: None  Mental Status: Normal mood and affect. Normal behavior. Normal judgment and thought content.   Assessment and Plan:  Pregnancy: G1P0 at [redacted]w[redacted]d  1. [redacted] weeks gestation of pregnancy  2. Encounter for supervision of normal first pregnancy in second trimester  3. Heart problem - Concern for aortic stenosis based on history? - Saw cardiology 10/05/20, who does not think she has aortic stenosis - echo scheduled for 6/10  Preterm labor symptoms and general obstetric precautions including but not limited to vaginal bleeding, contractions, leaking of fluid and fetal movement were reviewed in detail with the patient. Please refer to After Visit Summary for other  counseling recommendations.   Return in about 2 weeks (around 10/25/2020) for high OB, in person.  Future Appointments  Date Time Provider Department Center  10/12/2020 12:40 PM [facility] ECHO 3 [id number]    Conan Bowens, MD

## 2020-10-12 ENCOUNTER — Ambulatory Visit (HOSPITAL_COMMUNITY): Payer: Managed Care, Other (non HMO) | Attending: Cardiology

## 2020-10-12 DIAGNOSIS — R011 Cardiac murmur, unspecified: Secondary | ICD-10-CM | POA: Insufficient documentation

## 2020-10-12 LAB — ECHOCARDIOGRAM COMPLETE
Area-P 1/2: 3.77 cm2
S' Lateral: 3.3 cm

## 2020-10-25 ENCOUNTER — Other Ambulatory Visit: Payer: Self-pay

## 2020-10-25 ENCOUNTER — Ambulatory Visit (INDEPENDENT_AMBULATORY_CARE_PROVIDER_SITE_OTHER): Payer: Managed Care, Other (non HMO) | Admitting: Family Medicine

## 2020-10-25 VITALS — BP 113/71 | HR 86 | Wt 147.0 lb

## 2020-10-25 DIAGNOSIS — Z3403 Encounter for supervision of normal first pregnancy, third trimester: Secondary | ICD-10-CM

## 2020-10-25 DIAGNOSIS — I519 Heart disease, unspecified: Secondary | ICD-10-CM

## 2020-10-25 NOTE — Progress Notes (Signed)
   PRENATAL VISIT NOTE  Subjective:  Joyce Crawford is a 30 y.o. G1P0 at [redacted]w[redacted]d being seen today for ongoing prenatal care.  She is currently monitored for the following issues for this low-risk pregnancy and has Supervision of normal first pregnancy on their problem list.  Patient reports occasional contractions.  Contractions: Irritability. Vag. Bleeding: None.  Movement: Present. Denies leaking of fluid.   The following portions of the patient's history were reviewed and updated as appropriate: allergies, current medications, past family history, past medical history, past social history, past surgical history and problem list.   Objective:   Vitals:   10/25/20 1444  BP: 113/71  Pulse: 86  Weight: 147 lb (66.7 kg)    Fetal Status: Fetal Heart Rate (bpm): 158   Movement: Present     General:  Alert, oriented and cooperative. Patient is in no acute distress.  Skin: Skin is warm and dry. No rash noted.   Cardiovascular: Normal heart rate noted  Respiratory: Normal respiratory effort, no problems with respiration noted  Abdomen: Soft, gravid, appropriate for gestational age.  Pain/Pressure: Present     Pelvic: Cervical exam deferred        Extremities: Normal range of motion.  Edema: None  Mental Status: Normal mood and affect. Normal behavior. Normal judgment and thought content.   Assessment and Plan:  Pregnancy: G1P0 at [redacted]w[redacted]d 1. Encounter for supervision of normal first pregnancy in third trimester FHT and FH normal  2. Heart problem Echo done 2 weeks ago - no evidence of congenital defect.   Preterm labor symptoms and general obstetric precautions including but not limited to vaginal bleeding, contractions, leaking of fluid and fetal movement were reviewed in detail with the patient. Please refer to After Visit Summary for other counseling recommendations.   Return in about 2 weeks (around 11/08/2020) for OB f/u, GBS.  No future appointments.  Levie Heritage, DO

## 2020-11-08 ENCOUNTER — Other Ambulatory Visit (HOSPITAL_COMMUNITY)
Admission: RE | Admit: 2020-11-08 | Discharge: 2020-11-08 | Disposition: A | Discharge status: Home / Self Care | Payer: Managed Care, Other (non HMO) | Source: Ambulatory Visit | Attending: Obstetrics & Gynecology | Admitting: Obstetrics & Gynecology

## 2020-11-08 ENCOUNTER — Ambulatory Visit (INDEPENDENT_AMBULATORY_CARE_PROVIDER_SITE_OTHER): Payer: Managed Care, Other (non HMO) | Admitting: Obstetrics & Gynecology

## 2020-11-08 ENCOUNTER — Other Ambulatory Visit: Payer: Self-pay

## 2020-11-08 VITALS — BP 101/67 | Wt 149.0 lb

## 2020-11-08 DIAGNOSIS — Z3403 Encounter for supervision of normal first pregnancy, third trimester: Secondary | ICD-10-CM | POA: Insufficient documentation

## 2020-11-08 LAB — OB RESULTS CONSOLE GBS: GBS: POSITIVE

## 2020-11-08 NOTE — Progress Notes (Signed)
   PRENATAL VISIT NOTE  Subjective:  Joyce Crawford is a 30 y.o. G1P0 at [redacted]w[redacted]d being seen today for ongoing prenatal care.  She is currently monitored for the following issues for this low-risk pregnancy and has Supervision of normal first pregnancy on their problem list.  Patient reports no complaints.   .  .   . Denies leaking of fluid.   The following portions of the patient's history were reviewed and updated as appropriate: allergies, current medications, past family history, past medical history, past social history, past surgical history and problem list.   Objective:  There were no vitals filed for this visit.  Fetal Status:           General:  Alert, oriented and cooperative. Patient is in no acute distress.  Skin: Skin is warm and dry. No rash noted.   Cardiovascular: Normal heart rate noted  Respiratory: Normal respiratory effort, no problems with respiration noted  Abdomen: Soft, gravid, appropriate for gestational age.        Pelvic: Cervical exam deferred        Extremities: Normal range of motion.     Mental Status: Normal mood and affect. Normal behavior. Normal judgment and thought content.   Assessment and Plan:  Pregnancy: G1P0 at [redacted]w[redacted]d 1. Encounter for supervision of normal first pregnancy in third trimester - Bedside US shows Vtx presentation (pt declined cervical exam) - Culture, beta strep (group b only) - Cervicovaginal ancillary only( [place]) -RTC 1 week  Term labor symptoms and general obstetric precautions including but not limited to vaginal bleeding, contractions, leaking of fluid and fetal movement were reviewed in detail with the patient. Please refer to After Visit Summary for other counseling recommendations.    Elsie Lincoln, MD

## 2020-11-10 LAB — CULTURE, BETA STREP (GROUP B ONLY)
MICRO NUMBER:: 12095123
SPECIMEN QUALITY:: ADEQUATE

## 2020-11-11 LAB — CERVICOVAGINAL ANCILLARY ONLY
Chlamydia: NEGATIVE
Comment: NEGATIVE
Comment: NORMAL
Neisseria Gonorrhea: NEGATIVE

## 2020-11-15 ENCOUNTER — Ambulatory Visit (INDEPENDENT_AMBULATORY_CARE_PROVIDER_SITE_OTHER): Payer: Managed Care, Other (non HMO) | Admitting: Obstetrics & Gynecology

## 2020-11-15 ENCOUNTER — Other Ambulatory Visit: Payer: Self-pay

## 2020-11-15 VITALS — BP 110/73 | HR 93 | Wt 151.0 lb

## 2020-11-15 DIAGNOSIS — Z3403 Encounter for supervision of normal first pregnancy, third trimester: Secondary | ICD-10-CM

## 2020-11-15 DIAGNOSIS — Z3A37 37 weeks gestation of pregnancy: Secondary | ICD-10-CM

## 2020-11-15 NOTE — Progress Notes (Signed)
PhQ-5 Gad-3

## 2020-11-15 NOTE — Progress Notes (Signed)
   PRENATAL VISIT NOTE  Subjective:  Joyce Crawford is a 30 y.o. G1P0 at [redacted]w[redacted]d being seen today for ongoing prenatal care.  She is currently monitored for the following issues for this low-risk pregnancy and has Supervision of normal first pregnancy on their problem list.  Patient reports  a little anxious and sad that she is not working .  Contractions: Not present. Vag. Bleeding: None, Bloody Show.  Movement: Present. Denies leaking of fluid.   The following portions of the patient's history were reviewed and updated as appropriate: allergies, current medications, past family history, past medical history, past social history, past surgical history and problem list.   Objective:   Vitals:   11/15/20 1343  BP: 110/73  Pulse: 93  Weight: 151 lb (68.5 kg)    Fetal Status: Fetal Heart Rate (bpm): 153 Fundal Height: 36 cm Movement: Present     General:  Alert, oriented and cooperative. Patient is in no acute distress.  Skin: Skin is warm and dry. No rash noted.   Cardiovascular: Normal heart rate noted  Respiratory: Normal respiratory effort, no problems with respiration noted  Abdomen: Soft, gravid, appropriate for gestational age.  Pain/Pressure: Present     Pelvic: Cervical exam deferred        Extremities: Normal range of motion.  Edema: None  Mental Status: Normal mood and affect. Normal behavior. Normal judgment and thought content.   Assessment and Plan:  Pregnancy: G1P0 at [redacted]w[redacted]d 1. Encounter for supervision of normal first pregnancy in third trimester GAD-7 and PHQ-9 scores are low.  We will reassess next week and also postpartum.  Patient feels better when she goes for walks and connects with people.  If she develops any distress between visits, patient will call.  Term labor symptoms and general obstetric precautions including but not limited to vaginal bleeding, contractions, leaking of fluid and fetal movement were reviewed in detail with the patient. Please refer to After  Visit Summary for other counseling recommendations.   Return in about 1 week (around 11/22/2020).  No future appointments.  Elsie Lincoln, MD

## 2020-11-22 ENCOUNTER — Ambulatory Visit (INDEPENDENT_AMBULATORY_CARE_PROVIDER_SITE_OTHER): Payer: Managed Care, Other (non HMO) | Admitting: Obstetrics and Gynecology

## 2020-11-22 ENCOUNTER — Other Ambulatory Visit: Payer: Self-pay

## 2020-11-22 ENCOUNTER — Encounter: Payer: Self-pay | Admitting: Obstetrics and Gynecology

## 2020-11-22 VITALS — BP 121/76 | HR 96 | Wt 152.0 lb

## 2020-11-22 DIAGNOSIS — Z3A38 38 weeks gestation of pregnancy: Secondary | ICD-10-CM

## 2020-11-22 DIAGNOSIS — Z3403 Encounter for supervision of normal first pregnancy, third trimester: Secondary | ICD-10-CM

## 2020-11-22 NOTE — Progress Notes (Signed)
   PRENATAL VISIT NOTE  Subjective:  Joyce Crawford is a 30 y.o. G1P0 at [redacted]w[redacted]d being seen today for ongoing prenatal care.  She is currently monitored for the following issues for this low-risk pregnancy and has Supervision of normal first pregnancy on their problem list.  Patient reports some hip pain. Contractions: Not present. Vag. Bleeding: None.  Movement: Present. Denies leaking of fluid.   The following portions of the patient's history were reviewed and updated as appropriate: allergies, current medications, past family history, past medical history, past social history, past surgical history and problem list.   Objective:   Vitals:   11/22/20 1403  BP: 121/76  Pulse: 96  Weight: 152 lb (68.9 kg)    Fetal Status: Fetal Heart Rate (bpm): 152   Movement: Present     General:  Alert, oriented and cooperative. Patient is in no acute distress.  Skin: Skin is warm and dry. No rash noted.   Cardiovascular: Normal heart rate noted  Respiratory: Normal respiratory effort, no problems with respiration noted  Abdomen: Soft, gravid, appropriate for gestational age.  Pain/Pressure: Absent     Pelvic: Cervical exam deferred        Extremities: Normal range of motion.  Edema: None  Mental Status: Normal mood and affect. Normal behavior. Normal judgment and thought content.   Assessment and Plan:  Pregnancy: G1P0 at [redacted]w[redacted]d 1. Encounter for supervision of normal first pregnancy in third trimester Reviewed fetal kick counts, reasons to present to MAU  2. [redacted] weeks gestation of pregnancy   Term labor symptoms and general obstetric precautions including but not limited to vaginal bleeding, contractions, leaking of fluid and fetal movement were reviewed in detail with the patient. Please refer to After Visit Summary for other counseling recommendations.   Return in about 1 week (around 11/29/2020) for in person, high OB.  Future Appointments  Date Time Provider Department Center  11/29/2020   3:45 PM Conan Bowens, MD [facility]    Conan Bowens, MD

## 2020-11-22 NOTE — Progress Notes (Signed)
PHQ 9:Score 0 GAD: Score 0 Pt states anxiety and depression have improved and she feels better

## 2020-11-29 ENCOUNTER — Ambulatory Visit (INDEPENDENT_AMBULATORY_CARE_PROVIDER_SITE_OTHER): Payer: Managed Care, Other (non HMO) | Admitting: Obstetrics and Gynecology

## 2020-11-29 ENCOUNTER — Encounter: Payer: Self-pay | Admitting: Obstetrics and Gynecology

## 2020-11-29 ENCOUNTER — Other Ambulatory Visit: Payer: Self-pay

## 2020-11-29 VITALS — BP 120/78 | HR 94 | Wt 155.0 lb

## 2020-11-29 DIAGNOSIS — Z3A39 39 weeks gestation of pregnancy: Secondary | ICD-10-CM

## 2020-11-29 DIAGNOSIS — Z3403 Encounter for supervision of normal first pregnancy, third trimester: Secondary | ICD-10-CM

## 2020-11-29 NOTE — Progress Notes (Signed)
   PRENATAL VISIT NOTE  Subjective:  Joyce Crawford is a 30 y.o. G1P0 at [redacted]w[redacted]d being seen today for ongoing prenatal care.  She is currently monitored for the following issues for this low-risk pregnancy and has Supervision of normal first pregnancy on their problem list.  Patient reports no complaints.  Contractions: Not present. Vag. Bleeding: None.  Movement: Present. Denies leaking of fluid.   The following portions of the patient's history were reviewed and updated as appropriate: allergies, current medications, past family history, past medical history, past social history, past surgical history and problem list.   Objective:   Vitals:   11/29/20 1546  BP: 120/78  Pulse: 94  Weight: 155 lb (70.3 kg)    Fetal Status: Fetal Heart Rate (bpm): 134   Movement: Present     General:  Alert, oriented and cooperative. Patient is in no acute distress.  Skin: Skin is warm and dry. No rash noted.   Cardiovascular: Normal heart rate noted  Respiratory: Normal respiratory effort, no problems with respiration noted  Abdomen: Soft, gravid, appropriate for gestational age.  Pain/Pressure: Present     Pelvic: Cervical exam deferred        Extremities: Normal range of motion.  Edema: None  Mental Status: Normal mood and affect. Normal behavior. Normal judgment and thought content.   Assessment and Plan:  Pregnancy: G1P0 at [redacted]w[redacted]d  1. Encounter for supervision of normal first pregnancy in third trimester Reviewed expectations for hospital, induction of labor, reasons to present to MAU IOL scheduled for 40-41 weeks  2. [redacted] weeks gestation of pregnancy   Term labor symptoms and general obstetric precautions including but not limited to vaginal bleeding, contractions, leaking of fluid and fetal movement were reviewed in detail with the patient. Please refer to After Visit Summary for other counseling recommendations.   Return in about 1 week (around 12/06/2020) for low OB, in person.  Future  Appointments  Date Time Provider Department Center  12/06/2020  3:50 PM Constant, Gigi Gin, MD [facility]    Conan Bowens, MD

## 2020-12-04 ENCOUNTER — Other Ambulatory Visit: Payer: Self-pay | Admitting: Advanced Practice Midwife

## 2020-12-06 ENCOUNTER — Ambulatory Visit (INDEPENDENT_AMBULATORY_CARE_PROVIDER_SITE_OTHER): Payer: Managed Care, Other (non HMO) | Admitting: Obstetrics and Gynecology

## 2020-12-06 ENCOUNTER — Telehealth (HOSPITAL_COMMUNITY): Payer: Self-pay | Admitting: *Deleted

## 2020-12-06 ENCOUNTER — Other Ambulatory Visit: Payer: Self-pay

## 2020-12-06 ENCOUNTER — Encounter: Payer: Self-pay | Admitting: Obstetrics and Gynecology

## 2020-12-06 VITALS — BP 119/76 | HR 91 | Wt 157.0 lb

## 2020-12-06 DIAGNOSIS — Z3A40 40 weeks gestation of pregnancy: Secondary | ICD-10-CM

## 2020-12-06 DIAGNOSIS — Z3403 Encounter for supervision of normal first pregnancy, third trimester: Secondary | ICD-10-CM | POA: Diagnosis not present

## 2020-12-06 NOTE — Progress Notes (Signed)
   PRENATAL VISIT NOTE  Subjective:  Joyce Crawford is a 30 y.o. G1P0 at [redacted]w[redacted]d being seen today for ongoing prenatal care.  She is currently monitored for the following issues for this low-risk pregnancy and has Supervision of normal first pregnancy on their problem list.  Patient reports no complaints.  Contractions: Not present. Vag. Bleeding: None.  Movement: Present. Denies leaking of fluid.   The following portions of the patient's history were reviewed and updated as appropriate: allergies, current medications, past family history, past medical history, past social history, past surgical history and problem list.   Objective:   Vitals:   12/06/20 1507  BP: 119/76  Pulse: 91  Weight: 157 lb (71.2 kg)    Fetal Status: Fetal Heart Rate (bpm): 150   Movement: Present     General:  Alert, oriented and cooperative. Patient is in no acute distress.  Skin: Skin is warm and dry. No rash noted.   Cardiovascular: Normal heart rate noted  Respiratory: Normal respiratory effort, no problems with respiration noted  Abdomen: Soft, gravid, appropriate for gestational age.  Pain/Pressure: Present     Pelvic: Cervical exam deferred        Extremities: Normal range of motion.  Edema: None  Mental Status: Normal mood and affect. Normal behavior. Normal judgment and thought content.   Assessment and Plan:  Pregnancy: G1P0 at [redacted]w[redacted]d 1. Encounter for supervision of normal first pregnancy in third trimester Patient is doing well without complaints Answered questions regarding upcoming IOL NST reviewed and reactive with baseline 150, mod variability, +accels, no decels TOCO: irregular contractions q 5-8 minutes  Term labor symptoms and general obstetric precautions including but not limited to vaginal bleeding, contractions, leaking of fluid and fetal movement were reviewed in detail with the patient. Please refer to After Visit Summary for other counseling recommendations.   Return in about 6  weeks (around 01/17/2021) for postpartum.  Future Appointments  Date Time Provider Department Center  12/06/2020  3:50 PM Steel Kerney, Gigi Gin, MD [facility]  12/12/2020  9:25 AM [facility] SCHED ROOM [facility] None    Catalina Antigua, MD

## 2020-12-06 NOTE — Telephone Encounter (Signed)
Preadmission screen  

## 2020-12-07 ENCOUNTER — Telehealth (HOSPITAL_COMMUNITY): Payer: Self-pay | Admitting: *Deleted

## 2020-12-07 NOTE — Telephone Encounter (Signed)
Preadmission screen  

## 2020-12-08 ENCOUNTER — Other Ambulatory Visit: Payer: Self-pay

## 2020-12-08 ENCOUNTER — Inpatient Hospital Stay (HOSPITAL_COMMUNITY)
Admission: AD | Admit: 2020-12-08 | Discharge: 2020-12-08 | Disposition: A | Discharge status: Home / Self Care | Payer: Managed Care, Other (non HMO) | Attending: Obstetrics and Gynecology | Admitting: Obstetrics and Gynecology

## 2020-12-08 ENCOUNTER — Encounter (HOSPITAL_COMMUNITY): Payer: Self-pay | Admitting: Obstetrics and Gynecology

## 2020-12-08 DIAGNOSIS — O471 False labor at or after 37 completed weeks of gestation: Secondary | ICD-10-CM | POA: Insufficient documentation

## 2020-12-08 DIAGNOSIS — Z3689 Encounter for other specified antenatal screening: Secondary | ICD-10-CM

## 2020-12-08 DIAGNOSIS — O480 Post-term pregnancy: Secondary | ICD-10-CM | POA: Insufficient documentation

## 2020-12-08 DIAGNOSIS — Z0371 Encounter for suspected problem with amniotic cavity and membrane ruled out: Secondary | ICD-10-CM | POA: Diagnosis present

## 2020-12-08 DIAGNOSIS — Z3A40 40 weeks gestation of pregnancy: Secondary | ICD-10-CM | POA: Diagnosis not present

## 2020-12-08 LAB — POCT FERN TEST: POCT Fern Test: NEGATIVE

## 2020-12-08 NOTE — MAU Note (Signed)
Patient presented to MAU with c/o of ctx since 1 am, with a pain rating 5/10. Patient stated having some leaking as she used the bathroom and leaking of fluid on her underwear since yesterday. Patients denies DFM and VB.   IOL 8/10

## 2020-12-08 NOTE — MAU Provider Note (Signed)
None      S: Ms. Eniyah Eastmond is a 30 y.o. G1P0 at [redacted]w[redacted]d  who presents to MAU today complaining of leaking of fluid since yesterday. She denies vaginal bleeding. She endorses contractions. She reports normal fetal movement.    O: BP 114/86 (BP Location: Right Arm)   Pulse 86   Temp 98.8 F (37.1 C) (Oral)   Resp 18   Ht 5' 0.24" (1.53 m)   Wt 72.8 kg   LMP 03/01/2020 Comment: per patient  SpO2 100%   BMI 31.10 kg/m  GENERAL: Well-developed, well-nourished female in no acute distress.  HEAD: Normocephalic, atraumatic.  CHEST: Normal effort of breathing, regular heart rate ABDOMEN: Soft, nontender, gravid PELVIC: Normal external female genitalia. Vagina is pink and rugated. Cervix with normal contour, no lesions. Normal discharge.  Negative pooling. Fern Collected  Cervical exam:  Dilation: Fingertip Effacement (%): Thick Cervical Position: Posterior Exam by:: Santiago Bur, RN   Fetal Monitoring: FHT: 145 bpm, Mod Var, -Decels, +Accels Toco: Q3-42min  Results for orders placed or performed during the hospital encounter of 12/08/20 (from the past 24 hour(s))  Fern Test     Status: Normal   Collection Time: 12/08/20  4:05 AM  Result Value Ref Range   POCT Fern Test Negative = intact amniotic membranes      A: SIUP at [redacted]w[redacted]d  Membranes intact NST Reactive  P: Labor Precautions Discharge to home   Gerrit Heck, [place] 12/08/2020 4:32 AM

## 2020-12-11 ENCOUNTER — Other Ambulatory Visit: Payer: Self-pay | Admitting: Obstetrics & Gynecology

## 2020-12-12 ENCOUNTER — Inpatient Hospital Stay (HOSPITAL_COMMUNITY): Payer: Managed Care, Other (non HMO)

## 2020-12-12 ENCOUNTER — Encounter (HOSPITAL_COMMUNITY): Payer: Self-pay | Admitting: Obstetrics and Gynecology

## 2020-12-12 ENCOUNTER — Inpatient Hospital Stay (HOSPITAL_COMMUNITY)
Admission: AD | Admit: 2020-12-12 | Discharge: 2020-12-16 | DRG: 786 | Disposition: A | Discharge status: Home / Self Care | Payer: Managed Care, Other (non HMO) | Attending: Obstetrics & Gynecology | Admitting: Obstetrics & Gynecology

## 2020-12-12 ENCOUNTER — Other Ambulatory Visit: Payer: Self-pay

## 2020-12-12 DIAGNOSIS — O339 Maternal care for disproportion, unspecified: Secondary | ICD-10-CM | POA: Diagnosis present

## 2020-12-12 DIAGNOSIS — Z3A40 40 weeks gestation of pregnancy: Secondary | ICD-10-CM | POA: Diagnosis not present

## 2020-12-12 DIAGNOSIS — O411290 Chorioamnionitis, unspecified trimester, not applicable or unspecified: Secondary | ICD-10-CM | POA: Diagnosis not present

## 2020-12-12 DIAGNOSIS — O411230 Chorioamnionitis, third trimester, not applicable or unspecified: Secondary | ICD-10-CM | POA: Diagnosis present

## 2020-12-12 DIAGNOSIS — D62 Acute posthemorrhagic anemia: Secondary | ICD-10-CM | POA: Diagnosis not present

## 2020-12-12 DIAGNOSIS — Z3403 Encounter for supervision of normal first pregnancy, third trimester: Secondary | ICD-10-CM

## 2020-12-12 DIAGNOSIS — O9081 Anemia of the puerperium: Secondary | ICD-10-CM | POA: Diagnosis not present

## 2020-12-12 DIAGNOSIS — O99824 Streptococcus B carrier state complicating childbirth: Secondary | ICD-10-CM | POA: Diagnosis present

## 2020-12-12 DIAGNOSIS — O480 Post-term pregnancy: Secondary | ICD-10-CM | POA: Diagnosis present

## 2020-12-12 HISTORY — DX: Depression, unspecified: F32.A

## 2020-12-12 HISTORY — DX: Anxiety disorder, unspecified: F41.9

## 2020-12-12 LAB — CBC
HCT: 32.4 % — ABNORMAL LOW (ref 36.0–46.0)
Hemoglobin: 10.3 g/dL — ABNORMAL LOW (ref 12.0–15.0)
MCH: 25.9 pg — ABNORMAL LOW (ref 26.0–34.0)
MCHC: 31.8 g/dL (ref 30.0–36.0)
MCV: 81.6 fL (ref 80.0–100.0)
Platelets: 293 10*3/uL (ref 150–400)
RBC: 3.97 MIL/uL (ref 3.87–5.11)
RDW: 14 % (ref 11.5–15.5)
WBC: 8.6 10*3/uL (ref 4.0–10.5)
nRBC: 0 % (ref 0.0–0.2)

## 2020-12-12 LAB — SARS CORONAVIRUS 2 (TAT 6-24 HRS): SARS Coronavirus 2: NEGATIVE

## 2020-12-12 LAB — RPR: RPR Ser Ql: NONREACTIVE

## 2020-12-12 LAB — TYPE AND SCREEN
ABO/RH(D): O POS
Antibody Screen: NEGATIVE

## 2020-12-12 MED ORDER — OXYCODONE-ACETAMINOPHEN 5-325 MG PO TABS: 1.0000 | ORAL_TABLET | ORAL | Status: DC | PRN

## 2020-12-12 MED ORDER — ACETAMINOPHEN 325 MG PO TABS
650.0000 mg | ORAL_TABLET | ORAL | Status: DC | PRN
  Administered 2020-12-13: 650 mg via ORAL

## 2020-12-12 MED ORDER — OXYTOCIN-SODIUM CHLORIDE 30-0.9 UT/500ML-% IV SOLN: 2.5000 [IU]/h | INTRAVENOUS | Status: DC

## 2020-12-12 MED ORDER — LIDOCAINE HCL (PF) 1 % IJ SOLN: 30.0000 mL | INTRAMUSCULAR | Status: DC | PRN

## 2020-12-12 MED ORDER — MISOPROSTOL 50MCG HALF TABLET
50.0000 ug | ORAL_TABLET | ORAL | Status: DC | PRN
  Administered 2020-12-12 (×2): 50 ug via BUCCAL
  Filled 2020-12-12 (×2): qty 1

## 2020-12-12 MED ORDER — OXYTOCIN BOLUS FROM INFUSION: 333.0000 mL | Freq: Once | INTRAVENOUS | Status: DC

## 2020-12-12 MED ORDER — ONDANSETRON HCL 4 MG/2ML IJ SOLN
4.0000 mg | Freq: Four times a day (QID) | INTRAMUSCULAR | Status: DC | PRN
  Administered 2020-12-12 – 2020-12-13 (×2): 4 mg via INTRAVENOUS
  Filled 2020-12-12 (×2): qty 2

## 2020-12-12 MED ORDER — PENICILLIN G POT IN DEXTROSE 60000 UNIT/ML IV SOLN
3.0000 10*6.[IU] | INTRAVENOUS | Status: DC
  Administered 2020-12-12 – 2020-12-13 (×4): 3 10*6.[IU] via INTRAVENOUS
  Filled 2020-12-12 (×5): qty 50

## 2020-12-12 MED ORDER — TERBUTALINE SULFATE 1 MG/ML IJ SOLN: 0.2500 mg | Freq: Once | INTRAMUSCULAR | Status: DC | PRN

## 2020-12-12 MED ORDER — OXYCODONE-ACETAMINOPHEN 5-325 MG PO TABS: 2.0000 | ORAL_TABLET | ORAL | Status: DC | PRN

## 2020-12-12 MED ORDER — MISOPROSTOL 25 MCG QUARTER TABLET
25.0000 ug | ORAL_TABLET | ORAL | Status: DC | PRN
  Filled 2020-12-12: qty 1

## 2020-12-12 MED ORDER — FENTANYL CITRATE (PF) 100 MCG/2ML IJ SOLN
100.0000 ug | INTRAMUSCULAR | Status: DC | PRN
Start: 2020-12-12 — End: 2020-12-14
  Administered 2020-12-12 – 2020-12-13 (×3): 100 ug via INTRAVENOUS
  Filled 2020-12-12 (×3): qty 2

## 2020-12-12 MED ORDER — LACTATED RINGERS IV SOLN: INTRAVENOUS | Status: DC

## 2020-12-12 MED ORDER — MISOPROSTOL 50MCG HALF TABLET
ORAL_TABLET | ORAL | Status: AC
  Administered 2020-12-12: 50 ug via BUCCAL
  Filled 2020-12-12: qty 1

## 2020-12-12 MED ORDER — LACTATED RINGERS IV SOLN
500.0000 mL | INTRAVENOUS | Status: DC | PRN
  Administered 2020-12-12 – 2020-12-13 (×3): 500 mL via INTRAVENOUS

## 2020-12-12 MED ORDER — SOD CITRATE-CITRIC ACID 500-334 MG/5ML PO SOLN: 30.0000 mL | ORAL | Status: DC | PRN

## 2020-12-12 MED ORDER — PENICILLIN G POTASSIUM 5000000 UNITS IJ SOLR
5.0000 10*6.[IU] | Freq: Once | INTRAMUSCULAR | Status: AC
  Administered 2020-12-12: 5 10*6.[IU] via INTRAVENOUS
  Filled 2020-12-12: qty 5

## 2020-12-12 NOTE — Progress Notes (Signed)
Labor Progress Note Joyce Crawford is a 30 y.o. G1P0 at [redacted]w[redacted]d presented for IOL post dates S:  Doing well, feeling some of her contractions. Does not want a foley bulb  O:  BP 132/80   Pulse 83   Temp 98.1 F (36.7 C) (Oral)   Resp 15   Ht 5' (1.524 m)   Wt 72.6 kg   LMP 03/01/2020 Comment: per patient  BMI 31.25 kg/m  EFM: 145bpm/moderate variability/+accels, no decels Toco: q2-4 min  CVE: Dilation: 2 Effacement (%): 50 Station: -3 Presentation: Vertex Exam by:: Mathis Fare, MD   A&P: 30 y.o. G1P0 [redacted]w[redacted]d IOL post dates #Labor: Early labor, s/p cytotec x 2, will give a third now. Patient still declining foley bulb. #Pain: PRN  #FWB: Cat 1 #GBS positive< PCN   Nelson Chimes, MD 9:18 PM

## 2020-12-12 NOTE — H&P (Signed)
OBSTETRIC ADMISSION HISTORY AND PHYSICAL  Joyce Crawford is a 30 y.o. female G1P0 with IUP at [redacted]w[redacted]d by  presenting for IOL for postdates. She reports +FMs, no LOF, no VB, no blurry vision, headaches, peripheral edema, or RUQ pain.  She plans on breast and bottle feeding. She declined birth control postpartum.  She received her prenatal care at  [facility] .    Dating: By LMP confirmed on Ultrasound --->  Estimated Date of Delivery: 12/06/20  Sono:   @[redacted]w[redacted]d , CWD, normal anatomy, cephalic presentation, placenta posterior, 297g, 34% EFW  Prenatal History/Complications:  Anxiety/Panic attacks Maternal heart issue (reports normal echo previously)  Past Medical History: Past Medical History:  Diagnosis Date   Anxiety    Depression    Past Surgical History: No past surgical history on file.  Obstetrical History: OB History     Gravida  1   Para      Term      Preterm      AB      Living         SAB      IAB      Ectopic      Multiple      Live Births             Social History Social History   Socioeconomic History   Marital status: Married    Spouse name: Not on file   Number of children: Not on file   Years of education: Not on file   Highest education level: Not on file  Occupational History   Occupation:    Employer: [employer]  Tobacco Use   Smoking status: Never   Smokeless tobacco: Never  Vaping Use   Vaping Use: Never used  Substance and Sexual Activity   Alcohol use: No    Comment: none   Drug use: No   Sexual activity: Yes    Birth control/protection: None  Other Topics Concern   Not on file  Social History Narrative   Not on file   Social Determinants of Health   Financial Resource Strain: Not on file  Food Insecurity: Not on file  Transportation Needs: Not on file  Physical Activity: Not on file  Stress: Not on file  Social Connections: Not on file    Family History: Family History  Problem  Relation Age of Onset   Hypertension Mother     Allergies: No Known Allergies  Medications Prior to Admission  Medication Sig Dispense Refill Last Dose   calcium-vitamin D 250-100 MG-UNIT tablet Take 1 tablet by mouth 2 (two) times daily.      Prenatal Vit-Fe Fumarate-FA (PRENATAL VITAMIN PO) Take by mouth.        Review of Systems   All systems reviewed and negative except as stated in HPI  Blood pressure 114/63, pulse 74, temperature 98.5 F (36.9 C), temperature source Oral, resp. rate 16, height 5' (1.524 m), weight 72.6 kg, last menstrual period 03/01/2020.  General appearance: alert, cooperative, and no distress Lungs: normal work of breathing on room air Heart: normal rate, warm and well perfused Abdomen: soft, non-tender Extremities: no LE edema Presentation: Cephalic confirmed by bedside ultrasound Fetal monitoring: Baseline 135 bpm, moderate variability, + accels, no decels Uterine activity: Irregular contractions  SVE: Cervix posterior, only able to palpate anterior aspect of cervix, thick effacement.  Prenatal labs: ABO, Rh: --/--/O POS (08/10 12-07-1983) Antibody: NEG (08/10 0843) Rubella: 13.10 (01/11 1514) RPR: NON REACTIVE (08/10 0843)  HBsAg: NON-REACTIVE (01/11 1514)  HIV: NON-REACTIVE (05/10 0955)  GBS: Positive/-- (07/07 0000)  2 hr Glucola normal Genetic screening declined Anatomy US Normal  Prenatal Transfer Tool  Maternal Diabetes: No Genetic Screening: Declined Maternal Ultrasounds/Referrals: Normal Fetal Ultrasounds or other Referrals:  None Maternal Substance Abuse:  No Significant Maternal Medications:  None Significant Maternal Lab Results: Group B Strep positive  Results for orders placed or performed during the hospital encounter of 12/12/20 (from the past 24 hour(s))  CBC   Collection Time: 12/12/20  8:43 AM  Result Value Ref Range   WBC 8.6 4.0 - 10.5 K/uL   RBC 3.97 3.87 - 5.11 MIL/uL   Hemoglobin 10.3 (L) 12.0 - 15.0 g/dL   HCT  56.4 (L) 33.2 - 46.0 %   MCV 81.6 80.0 - 100.0 fL   MCH 25.9 (L) 26.0 - 34.0 pg   MCHC 31.8 30.0 - 36.0 g/dL   RDW 95.1 88.4 - 16.6 %   Platelets 293 150 - 400 K/uL   nRBC 0.0 0.0 - 0.2 %  RPR   Collection Time: 12/12/20  8:43 AM  Result Value Ref Range   RPR Ser Ql NON REACTIVE NON REACTIVE  Type and screen   Collection Time: 12/12/20  8:43 AM  Result Value Ref Range   ABO/RH(D) O POS    Antibody Screen NEG    Sample Expiration      12/15/2020,2359 Performed at [facility], [address] [place], [place] [address]     Patient Active Problem List   Diagnosis Date Noted   Post-dates pregnancy 12/12/2020   Supervision of normal first pregnancy 05/15/2020    Assessment/Plan:  Starkisha Tullis is a 30 y.o. G1P0 at [redacted]w[redacted]d here for IOL for postdates.  #Labor: Difficult cervical exam. Cervix is thick and posterior. Will start with Cytotec 50 mcg buccal and reassess in 4 hours. Will consider FB next check if able. May need speculum to place. #Pain: PRN #FWB: Cat 1 tracing #ID: GBS+, PCN ordered #MOF: Bottle and breast #MOC: None  Worthy Rancher, MD  Obstetrics Fellow 12/12/2020, 2:11 PM

## 2020-12-12 NOTE — Progress Notes (Signed)
Joyce Crawford is a 30 y.o. G1P0 at [redacted]w[redacted]d by LMP admitted for induction of labor due to post dates.  Subjective: Patient reports she is feeling well. Her contractions are stronger after the Cytotec. She has no other concerns at this time.  Objective: BP 124/81   Pulse 75   Temp 98.1 F (36.7 C) (Oral)   Resp 16   Ht 5' (1.524 m)   Wt 72.6 kg   LMP 03/01/2020 Comment: per patient  BMI 31.25 kg/m   FHT:  Baseline 135 bpm, moderate variability, + accels, no decels UC:   Contractions every 4-5 minutes  SVE:   Dilation: 2 Effacement (%): 50 Station: -3 Exam by:: Mathis Fare, MD  Labs: Lab Results  Component Value Date   WBC 8.6 12/12/2020   HGB 10.3 (L) 12/12/2020   HCT 32.4 (L) 12/12/2020   MCV 81.6 12/12/2020   PLT 293 12/12/2020    Assessment / Plan: 30 y.o. G1P0 at [redacted]w[redacted]d by LMP admitted for induction of labor due to post dates.  Labor: Patient has progressed well s/p Cytotec 50 mcg x1. SVE now 2/50/-3. Discussed option for foley balloon versus additional Cytotec. Patient would like to proceed with Cytotec for now. Will reassess in 4 hours. Fetal Wellbeing:  Category 1 tracing Pain Control:  PRN I/D:  GBS positive; PCN ordered Anticipated MOD:  SVD  Worthy Rancher Obstetrics Fellow 12/12/2020, 5:37 PM

## 2020-12-13 ENCOUNTER — Inpatient Hospital Stay (HOSPITAL_COMMUNITY): Payer: Managed Care, Other (non HMO) | Admitting: Anesthesiology

## 2020-12-13 ENCOUNTER — Encounter (HOSPITAL_COMMUNITY): Payer: Self-pay | Admitting: Obstetrics and Gynecology

## 2020-12-13 ENCOUNTER — Encounter (HOSPITAL_COMMUNITY)
Admission: AD | Disposition: A | Discharge status: Home / Self Care | Payer: Self-pay | Source: Home / Self Care | Attending: Obstetrics & Gynecology

## 2020-12-13 DIAGNOSIS — O99824 Streptococcus B carrier state complicating childbirth: Secondary | ICD-10-CM

## 2020-12-13 DIAGNOSIS — O411290 Chorioamnionitis, unspecified trimester, not applicable or unspecified: Secondary | ICD-10-CM | POA: Diagnosis not present

## 2020-12-13 DIAGNOSIS — O480 Post-term pregnancy: Secondary | ICD-10-CM

## 2020-12-13 DIAGNOSIS — Z3A40 40 weeks gestation of pregnancy: Secondary | ICD-10-CM

## 2020-12-13 DIAGNOSIS — O411230 Chorioamnionitis, third trimester, not applicable or unspecified: Secondary | ICD-10-CM

## 2020-12-13 SURGERY — Surgical Case
Anesthesia: Epidural

## 2020-12-13 MED ORDER — LACTATED RINGERS IV SOLN: 500.0000 mL | Freq: Once | INTRAVENOUS | Status: DC

## 2020-12-13 MED ORDER — PHENYLEPHRINE 40 MCG/ML (10ML) SYRINGE FOR IV PUSH (FOR BLOOD PRESSURE SUPPORT): 80.0000 ug | PREFILLED_SYRINGE | INTRAVENOUS | Status: DC | PRN

## 2020-12-13 MED ORDER — NALOXONE HCL 4 MG/10ML IJ SOLN
1.0000 ug/kg/h | INTRAVENOUS | Status: DC | PRN
  Filled 2020-12-13: qty 5

## 2020-12-13 MED ORDER — OXYCODONE HCL 5 MG/5ML PO SOLN
5.0000 mg | Freq: Once | ORAL | Status: DC | PRN
Start: 2020-12-13 — End: 2020-12-14

## 2020-12-13 MED ORDER — PHENYLEPHRINE 40 MCG/ML (10ML) SYRINGE FOR IV PUSH (FOR BLOOD PRESSURE SUPPORT)
PREFILLED_SYRINGE | INTRAVENOUS | Status: AC
  Filled 2020-12-13: qty 20

## 2020-12-13 MED ORDER — FENTANYL CITRATE (PF) 100 MCG/2ML IJ SOLN
INTRAMUSCULAR | Status: DC | PRN
  Administered 2020-12-13: 100 ug via EPIDURAL

## 2020-12-13 MED ORDER — FENTANYL CITRATE (PF) 100 MCG/2ML IJ SOLN
INTRAMUSCULAR | Status: DC | PRN
  Administered 2020-12-13: 50 ug via INTRAVENOUS

## 2020-12-13 MED ORDER — OXYTOCIN-SODIUM CHLORIDE 30-0.9 UT/500ML-% IV SOLN
1.0000 m[IU]/min | INTRAVENOUS | Status: DC
  Administered 2020-12-13: 2 m[IU]/min via INTRAVENOUS
  Administered 2020-12-13: 4 m[IU]/min via INTRAVENOUS
  Filled 2020-12-13: qty 500

## 2020-12-13 MED ORDER — ONDANSETRON HCL 4 MG/2ML IJ SOLN
INTRAMUSCULAR | Status: DC | PRN
  Administered 2020-12-13: 4 mg via INTRAVENOUS

## 2020-12-13 MED ORDER — ACETAMINOPHEN 325 MG PO TABS
ORAL_TABLET | ORAL | Status: AC
  Filled 2020-12-13: qty 2

## 2020-12-13 MED ORDER — FENTANYL-BUPIVACAINE-NACL 0.5-0.125-0.9 MG/250ML-% EP SOLN
EPIDURAL | Status: DC | PRN
  Administered 2020-12-13: 12 mL/h via EPIDURAL

## 2020-12-13 MED ORDER — KETOROLAC TROMETHAMINE 30 MG/ML IJ SOLN
30.0000 mg | Freq: Once | INTRAMUSCULAR | Status: AC | PRN
  Administered 2020-12-13: 30 mg via INTRAVENOUS

## 2020-12-13 MED ORDER — DIPHENHYDRAMINE HCL 25 MG PO CAPS: 25.0000 mg | ORAL_CAPSULE | ORAL | Status: DC | PRN

## 2020-12-13 MED ORDER — SODIUM CHLORIDE 0.9 % IV SOLN
500.0000 mg | INTRAVENOUS | Status: AC
  Administered 2020-12-13: 500 mg via INTRAVENOUS

## 2020-12-13 MED ORDER — LIDOCAINE-EPINEPHRINE (PF) 2 %-1:200000 IJ SOLN
INTRAMUSCULAR | Status: DC | PRN
  Administered 2020-12-13 (×2): 1 mL via EPIDURAL
  Administered 2020-12-13 (×2): 5 mL via EPIDURAL

## 2020-12-13 MED ORDER — FENTANYL-BUPIVACAINE-NACL 0.5-0.125-0.9 MG/250ML-% EP SOLN
12.0000 mL/h | EPIDURAL | Status: DC | PRN
Start: 2020-12-13 — End: 2020-12-14
  Filled 2020-12-13: qty 250

## 2020-12-13 MED ORDER — SODIUM CHLORIDE 0.9 % IV SOLN
INTRAVENOUS | Status: AC
  Filled 2020-12-13: qty 500

## 2020-12-13 MED ORDER — NALBUPHINE HCL 10 MG/ML IJ SOLN: 5.0000 mg | Freq: Once | INTRAMUSCULAR | Status: DC | PRN

## 2020-12-13 MED ORDER — ONDANSETRON HCL 4 MG/2ML IJ SOLN: 4.0000 mg | Freq: Once | INTRAMUSCULAR | Status: DC | PRN

## 2020-12-13 MED ORDER — DEXAMETHASONE SODIUM PHOSPHATE 4 MG/ML IJ SOLN
INTRAMUSCULAR | Status: DC | PRN
  Administered 2020-12-13: 10 mg via INTRAVENOUS

## 2020-12-13 MED ORDER — HYDROMORPHONE HCL 1 MG/ML IJ SOLN: 0.2500 mg | INTRAMUSCULAR | Status: DC | PRN

## 2020-12-13 MED ORDER — TERBUTALINE SULFATE 1 MG/ML IJ SOLN: 0.2500 mg | Freq: Once | INTRAMUSCULAR | Status: DC | PRN

## 2020-12-13 MED ORDER — SCOPOLAMINE 1 MG/3DAYS TD PT72
1.0000 | MEDICATED_PATCH | Freq: Once | TRANSDERMAL | Status: DC
  Administered 2020-12-13: 1.5 mg via TRANSDERMAL

## 2020-12-13 MED ORDER — SODIUM CHLORIDE 0.9% FLUSH: 3.0000 mL | INTRAVENOUS | Status: DC | PRN

## 2020-12-13 MED ORDER — ACETAMINOPHEN 500 MG PO TABS
1000.0000 mg | ORAL_TABLET | Freq: Once | ORAL | Status: AC
  Administered 2020-12-13: 1000 mg via ORAL
  Filled 2020-12-13: qty 2

## 2020-12-13 MED ORDER — ALBUMIN HUMAN 5 % IV SOLN
INTRAVENOUS | Status: AC
  Filled 2020-12-13: qty 500

## 2020-12-13 MED ORDER — DIPHENHYDRAMINE HCL 50 MG/ML IJ SOLN: 12.5000 mg | INTRAMUSCULAR | Status: DC | PRN

## 2020-12-13 MED ORDER — OXYCODONE HCL 5 MG PO TABS: 5.0000 mg | ORAL_TABLET | Freq: Once | ORAL | Status: DC | PRN

## 2020-12-13 MED ORDER — EPHEDRINE 5 MG/ML INJ: 10.0000 mg | INTRAVENOUS | Status: DC | PRN

## 2020-12-13 MED ORDER — FENTANYL CITRATE (PF) 100 MCG/2ML IJ SOLN
INTRAMUSCULAR | Status: AC
  Filled 2020-12-13: qty 2

## 2020-12-13 MED ORDER — SODIUM CHLORIDE 0.9 % IV SOLN
2.0000 g | Freq: Four times a day (QID) | INTRAVENOUS | Status: DC
  Administered 2020-12-13 (×2): 2 g via INTRAVENOUS
  Filled 2020-12-13 (×2): qty 2000

## 2020-12-13 MED ORDER — NALOXONE HCL 0.4 MG/ML IJ SOLN: 0.4000 mg | INTRAMUSCULAR | Status: DC | PRN

## 2020-12-13 MED ORDER — NALBUPHINE HCL 10 MG/ML IJ SOLN: 5.0000 mg | INTRAMUSCULAR | Status: DC | PRN

## 2020-12-13 MED ORDER — ALBUMIN HUMAN 5 % IV SOLN: INTRAVENOUS | Status: DC | PRN

## 2020-12-13 MED ORDER — KETOROLAC TROMETHAMINE 30 MG/ML IJ SOLN
INTRAMUSCULAR | Status: AC
  Filled 2020-12-13: qty 1

## 2020-12-13 MED ORDER — OXYTOCIN-SODIUM CHLORIDE 30-0.9 UT/500ML-% IV SOLN
INTRAVENOUS | Status: DC | PRN
Start: 2020-12-13 — End: 2020-12-13
  Administered 2020-12-13: 250 mL via INTRAVENOUS

## 2020-12-13 MED ORDER — ONDANSETRON HCL 4 MG/2ML IJ SOLN: 4.0000 mg | Freq: Three times a day (TID) | INTRAMUSCULAR | Status: DC | PRN

## 2020-12-13 MED ORDER — SODIUM CHLORIDE 0.9 % IV SOLN
INTRAVENOUS | Status: DC | PRN
  Administered 2020-12-13: 40 ug via INTRAVENOUS

## 2020-12-13 MED ORDER — MORPHINE SULFATE (PF) 0.5 MG/ML IJ SOLN
INTRAMUSCULAR | Status: AC
  Filled 2020-12-13: qty 10

## 2020-12-13 MED ORDER — MORPHINE SULFATE (PF) 0.5 MG/ML IJ SOLN
INTRAMUSCULAR | Status: DC | PRN
  Administered 2020-12-13: 3 mg via EPIDURAL

## 2020-12-13 MED ORDER — ACETAMINOPHEN 160 MG/5ML PO SOLN
ORAL | Status: AC
  Filled 2020-12-13: qty 20.3

## 2020-12-13 MED ORDER — SOD CITRATE-CITRIC ACID 500-334 MG/5ML PO SOLN
30.0000 mL | ORAL | Status: DC
  Administered 2020-12-13: 30 mL via ORAL
  Filled 2020-12-13: qty 30

## 2020-12-13 MED ORDER — METOCLOPRAMIDE HCL 5 MG/ML IJ SOLN
INTRAMUSCULAR | Status: AC
  Filled 2020-12-13: qty 2

## 2020-12-13 MED ORDER — LIDOCAINE HCL (PF) 1 % IJ SOLN
INTRAMUSCULAR | Status: DC | PRN
  Administered 2020-12-13: 5 mL via EPIDURAL

## 2020-12-13 MED ORDER — ACETAMINOPHEN 10 MG/ML IV SOLN
INTRAVENOUS | Status: AC
  Filled 2020-12-13: qty 100

## 2020-12-13 MED ORDER — GENTAMICIN SULFATE 40 MG/ML IJ SOLN
5.0000 mg/kg | INTRAVENOUS | Status: DC
  Administered 2020-12-13: 280 mg via INTRAVENOUS
  Filled 2020-12-13 (×2): qty 7

## 2020-12-13 MED ORDER — SCOPOLAMINE 1 MG/3DAYS TD PT72
MEDICATED_PATCH | TRANSDERMAL | Status: AC
  Filled 2020-12-13: qty 1

## 2020-12-13 MED ORDER — CEFAZOLIN SODIUM-DEXTROSE 2-4 GM/100ML-% IV SOLN
2.0000 g | INTRAVENOUS | Status: AC
  Administered 2020-12-13: 2 g via INTRAVENOUS

## 2020-12-13 SURGICAL SUPPLY — 37 items
ADH SKN CLS APL DERMABOND .7 (GAUZE/BANDAGES/DRESSINGS)
APL SKNCLS STERI-STRIP NONHPOA (GAUZE/BANDAGES/DRESSINGS) ×1
BENZOIN TINCTURE PRP APPL 2/3 (GAUZE/BANDAGES/DRESSINGS) ×2 IMPLANT
CHLORAPREP W/TINT 26ML (MISCELLANEOUS) ×2 IMPLANT
CLAMP CORD UMBIL (MISCELLANEOUS) IMPLANT
CLOTH BEACON ORANGE TIMEOUT ST (SAFETY) ×2 IMPLANT
DERMABOND ADVANCED (GAUZE/BANDAGES/DRESSINGS)
DERMABOND ADVANCED .7 DNX12 (GAUZE/BANDAGES/DRESSINGS) IMPLANT
DRSG OPSITE POSTOP 4X10 (GAUZE/BANDAGES/DRESSINGS) ×2 IMPLANT
ELECT REM PT RETURN 9FT ADLT (ELECTROSURGICAL) ×2
ELECTRODE REM PT RTRN 9FT ADLT (ELECTROSURGICAL) ×1 IMPLANT
EXTRACTOR VACUUM KIWI (MISCELLANEOUS) IMPLANT
GLOVE BIOGEL PI IND STRL 7.0 (GLOVE) ×3 IMPLANT
GLOVE BIOGEL PI INDICATOR 7.0 (GLOVE) ×3
GLOVE ECLIPSE 6.5 STRL STRAW (GLOVE) ×2 IMPLANT
GOWN STRL REUS W/TWL LRG LVL3 (GOWN DISPOSABLE) ×6 IMPLANT
KIT ABG SYR 3ML LUER SLIP (SYRINGE) IMPLANT
NEEDLE HYPO 25X5/8 SAFETYGLIDE (NEEDLE) IMPLANT
NS IRRIG 1000ML POUR BTL (IV SOLUTION) ×2 IMPLANT
PACK C SECTION WH (CUSTOM PROCEDURE TRAY) ×2 IMPLANT
PAD ABD 7.5X8 STRL (GAUZE/BANDAGES/DRESSINGS) ×2 IMPLANT
PAD OB MATERNITY 4.3X12.25 (PERSONAL CARE ITEMS) ×2 IMPLANT
PENCIL SMOKE EVAC W/HOLSTER (ELECTROSURGICAL) ×2 IMPLANT
RTRCTR C-SECT PINK 25CM LRG (MISCELLANEOUS) ×2 IMPLANT
STRIP CLOSURE SKIN 1/2X4 (GAUZE/BANDAGES/DRESSINGS) ×2 IMPLANT
SUT PLAIN 0 NONE (SUTURE) IMPLANT
SUT PLAIN 2 0 XLH (SUTURE) IMPLANT
SUT VIC AB 0 CT1 27 (SUTURE) ×4
SUT VIC AB 0 CT1 27XBRD ANBCTR (SUTURE) ×2 IMPLANT
SUT VIC AB 0 CTX 36 (SUTURE) ×8
SUT VIC AB 0 CTX36XBRD ANBCTRL (SUTURE) ×4 IMPLANT
SUT VIC AB 2-0 CT1 27 (SUTURE) ×2
SUT VIC AB 2-0 CT1 TAPERPNT 27 (SUTURE) ×1 IMPLANT
SUT VIC AB 4-0 KS 27 (SUTURE) ×2 IMPLANT
TOWEL OR 17X24 6PK STRL BLUE (TOWEL DISPOSABLE) ×2 IMPLANT
TRAY FOLEY W/BAG SLVR 14FR LF (SET/KITS/TRAYS/PACK) IMPLANT
WATER STERILE IRR 1000ML POUR (IV SOLUTION) ×2 IMPLANT

## 2020-12-13 NOTE — Progress Notes (Signed)
Labor Progress Note Ritha Dau is a 30 y.o. G1P0 at [redacted]w[redacted]d presented for IOL post dates. S:  Doing well after epidural, is barely feeling her contractions.   O:  BP 112/66   Pulse (!) 106   Temp (!) 100.6 F (38.1 C) (Axillary)   Resp 16   Ht 5' (1.524 m)   Wt 72.6 kg   LMP 03/01/2020 Comment: per patient  SpO2 100%   BMI 31.25 kg/m  EFM: 160bpm/min-moderate variability/+accels, occasional variable decels. Toco: q2-4 min  CVE: Dilation: 7 Effacement (%): 90 Station: -1, 0 Presentation: Vertex Exam by:: Ileana Ladd, RN   A&P: 30 y.o. G1P0 [redacted]w[redacted]d IOL post dates. #Labor:  labor, s/p cytotec x 3. On pitocin 29ml/hr. SROM@2230 -light mec. She has been progressing well.  #Pain: Epidural in place #FWB: Cat 2>no late decels present, fluctuates between minimal and moderate variability-will continue to monitor and do position changes.  #Triple I: Pt had a fever of 100.6 with fetal tachy. There is concern for triple I, so we will continue with amp/gent and tylenol. Closely monitor.  #GBS positive< PCN   Alfredo Martinez, MD 11:08 AM

## 2020-12-13 NOTE — Transfer of Care (Signed)
Immediate Anesthesia Transfer of Care Note  Patient: Joyce Crawford  Procedure(s) Performed: CESAREAN SECTION  Patient Location: PACU  Anesthesia Type:Epidural  Level of Consciousness: awake, alert , oriented and patient cooperative  Airway & Oxygen Therapy: Patient Spontanous Breathing  Post-op Assessment: Report given to RN and Post -op Vital signs reviewed and stable  Post vital signs: Reviewed and stable  Last Vitals:  Vitals Value Taken Time  BP    Temp    Pulse    Resp    SpO2      Last Pain:  Vitals:   12/13/20 1932  TempSrc: Oral  PainSc: 0-No pain         Complications: No notable events documented.

## 2020-12-13 NOTE — Op Note (Addendum)
Primary Cesarean Section Op Note  PreOp Diagnosis: Arrest of descent, Supervision of Pregnancy PostOp Diagnosis: Same Procedure: Primary low transverse cesarean section Surgeon: Dr. Clide Dales, Dr. Scheryl Darter, Dr. Myna Hidalgo Assistant: Warner Mccreedy Anesthesia: Spinal Complications: None EBL: 1010cc UOP: 100 cc Fluids: 2300 Crystalloid +500 albumin  INDICATIONS: Arrest of Descent after 3 hours of pushing   PROCEDURE:  Informed consent was obtained from the patient with risks, benefits, complications, treatment options, and expected outcomes discussed with the patient.  The patient concurred with the proposed plan, giving informed consent with form signed.   The patient was taken to Operating Room, and identified with the procedure verified as C-Section Delivery with Time Out. With induction of anesthesia, the patient was prepped and draped in the usual sterile fashion. A Pfannenstiel incision was made and carried down through the subcutaneous tissue to the fascia. The fascia was incised in the midline and extended transversely. The superior aspect of the fascial incision was grasped with Kochers elevated and the underlying muscle dissected off. The inferior aspect of the facial incision was in similar fashion, grasped elevated and rectus muscles dissected off. The peritoneum was identified and entered bluntly. Peritoneal incision was extended longitudinally. The alexis retractor was then placed after confirming bladder was low and did not need a bladder flap. A low transverse uterine incision was made and the infants head delivered atraumatically. After the umbilical cord was clamped and cut cord blood and cord gases was obtained for evaluation.   The placenta was removed intact and appeared normal. The uterine outline, tubes and ovaries appeared normal. The uterine incision was closed as follows: initially the right corner was sutured with running locked sutures of 0 vicryl for hemostasis  and to close a superficial extension. Then  running locked sutures of 0 Vicryl were used to close the whole hysterotomy and a second layer of the same stitch was used in an imbricating fashion.  Excellent hemostasis was obtained.  The pericolic gutters were then cleared of all clots and debris. The peritoneum was re-approximated with 2-0 vicryl.  The fascia was then reapproximated with running sutures of 0 Vicryl. The skin was closed with 4-0 vicryl in a subcuticular fashion.  Instrument, sponge, and needle counts were correct prior the abdominal closure and at the conclusion of the case. The patient was taken to recovery in stable condition.   Of note: Dr. Charlotta Newton called to emergent case prior to skin incision. Dr. Juliene Pina stepped in to start case, relived by backup faculty surgeon Dr. Debroah Loop at the point of hysterotomy closure and Dr. Charlotta Newton returned to case towards the end of hysterotomy closure and finished the rest of the case. Dr. Ephriam Jenkins was present for entire case.     Warner Mccreedy, MD, MPH OB Fellow, Faculty Practice

## 2020-12-13 NOTE — Anesthesia Preprocedure Evaluation (Signed)
Anesthesia Evaluation  Patient identified by MRN, date of birth, ID band Patient awake    Reviewed: Allergy & Precautions, NPO status , Patient's Chart, lab work & pertinent test results  Airway Mallampati: II  TM Distance: >3 FB Neck ROM: Full    Dental no notable dental hx. (+) Teeth Intact, Dental Advisory Given   Pulmonary neg pulmonary ROS,    Pulmonary exam normal breath sounds clear to auscultation       Cardiovascular Exercise Tolerance: Good Normal cardiovascular exam Rhythm:Regular Rate:Normal     Neuro/Psych negative neurological ROS     GI/Hepatic negative GI ROS, Neg liver ROS,   Endo/Other  negative endocrine ROS  Renal/GU negative Renal ROS     Musculoskeletal   Abdominal   Peds  Hematology Lab Results      Component                Value               Date                      WBC                      8.6                 12/12/2020                HGB                      10.3 (L)            12/12/2020                HCT                      32.4 (L)            12/12/2020                MCV                      81.6                12/12/2020                PLT                      293                 12/12/2020              Anesthesia Other Findings   Reproductive/Obstetrics (+) Pregnancy                             Anesthesia Physical Anesthesia Plan  ASA: 2  Anesthesia Plan: Epidural   Post-op Pain Management:    Induction:   PONV Risk Score and Plan:   Airway Management Planned:   Additional Equipment:   Intra-op Plan:   Post-operative Plan:   Informed Consent: I have reviewed the patients History and Physical, chart, labs and discussed the procedure including the risks, benefits and alternatives for the proposed anesthesia with the patient or authorized representative who has indicated his/her understanding and acceptance.       Plan Discussed with:    Anesthesia Plan Comments: (41 wk primagravida for LEA)  Anesthesia Quick Evaluation  

## 2020-12-13 NOTE — Progress Notes (Signed)
Labor Progress Note Joyce Crawford is a 30 y.o. G1P0 at 100w6d presented for IOL post dates. S:  Sitting in high fowlers position. She is not feeling the contractions.   O:  BP 137/85   Pulse 100   Temp 98.8 F (37.1 C) (Axillary)   Resp 17   Ht 5' (1.524 m)   Wt 72.6 kg   LMP 03/01/2020 Comment: per patient  SpO2 100%   BMI 31.25 kg/m  EFM: 140bpm/min-mod variability/-accels, variable decels  Toco: q2-4 min  CVE: Dilation: 10 Dilation Complete Date: 12/13/20 Dilation Complete Time: 1327 Effacement (%): 90 Station: 0 Presentation: Vertex Exam by:: Annabell Sabal   A&P: 30 y.o. G1P0 [redacted]w[redacted]d IOL post dates. #Labor: S/p cytotec x 3. On pitocin 43ml/hr. SROM@2230 -light mec. Pt is not feeling any pressure, and she has not been able to push efficiently. We will labor down with peanut ball for thirty more minutes. Then try pushing techniques to help her push appropriately.  #Pain: Epidural in place #FWB: Cat 2>no late decels present, fluctuates between minimal and moderate variability-will continue to monitor, using peanut ball.  #Triple I: Pt had a fever of 100.6 with fetal tachy@0949 . From concern for triple I, we continued with amp/gent and tylenol. No more fevers today.  #GBS positive< PCN   Alfredo Martinez, MD 3:42 PM

## 2020-12-13 NOTE — Progress Notes (Signed)
Labor Progress Note Joyce Crawford is a 30 y.o. G1P0 at [redacted]w[redacted]d presented for IOL post dates S:  Doing well, more comfortable after epidural. To room due to prolonged decel that occurred after epidural placement that recovered with positional changes  O:  BP 121/72   Pulse 79   Temp 98.4 F (36.9 C) (Oral)   Resp 14   Ht 5' (1.524 m)   Wt 72.6 kg   LMP 03/01/2020 Comment: per patient  BMI 31.25 kg/m  EFM: 140bpm/moderate variability/+accels, occasional early decels. Prolonged decel 3 minutes to the 90s Toco: q2-4 min  CVE: Dilation: 3.5 Effacement (%): 90 Station: -2 Presentation: Vertex Exam by:: A. Earna Coder, RN   A&P: 30 y.o. G1P0 [redacted]w[redacted]d IOL post dates #Labor:  labor, s/p cytotec x 3. Progressing with pitocin titrated per protocol #Pain: Epidural in place #FWB: Cat 2, reassuring after positional changes #GBS positive< PCN   Nelson Chimes, MD 5:27 AM

## 2020-12-13 NOTE — Progress Notes (Signed)
Labor Progress Note Joyce Crawford is a 30 y.o. G1P0 at [redacted]w[redacted]d presented for IOL post dates. S:  Pt is actively pushing with contractions. She is feeling contractions but not a lot of pain.   O:  BP 128/66   Pulse 90   Temp 98.5 F (36.9 C) (Oral)   Resp 17   Ht 5' (1.524 m)   Wt 72.6 kg   LMP 03/01/2020 Comment: per patient  SpO2 100%   BMI 31.25 kg/m  EFM: 150bpm/min-mod variability/+accels, no decels  Toco: q4-82min  CVE: Dilation: 10 Dilation Complete Date: 12/13/20 Dilation Complete Time: 1327 Effacement (%): 90 Station: 0 Presentation: Vertex Exam by:: Annabell Sabal   A&P: 30 y.o. G1P0 [redacted]w[redacted]d IOL post dates. #Labor: S/p cytotec x 3. On pitocin 34ml/hr, has gradually been titrating. SROM@2230 -light mec. Began pushing at 1620. Working with position change and different pushing techniques. Continue pushing. Discussed option of c-section if we got to that point after pushing for a long period of time.  #Pain: Epidural in place #FWB: Cat 1>Overall reassuring #Triple I: Pt had a fever of 100.6 with fetal tachy@0949 . From concern for triple I, we continued with amp/gent and tylenol. No more fevers today.  #GBS positive< PCN   Alfredo Martinez, MD 6:41 PM

## 2020-12-13 NOTE — Progress Notes (Signed)
Pharmacy Antibiotic Note  Francis Vanaken is a 30 y.o. female admitted on 12/12/2020 with  chorioamnionitis .  Pharmacy has been consulted for gentamicin dosing.  Plan: Gentamicin 5 mg/kg q24hours.  Height: 5' (152.4 cm) Weight: 72.6 kg (160 lb) IBW/kg (Calculated) : 45.5  Temp (24hrs), Avg:98.9 F (37.2 C), Min:98 F (36.7 C), Max:100.6 F (38.1 C)  Recent Labs  Lab 12/12/20 0843  WBC 8.6    CrCl cannot be calculated (Patient's most recent lab result is older than the maximum 21 days allowed.).    No Known Allergies  Antimicrobials this admission: ampicillin 2gm IV q6h 8/11 >>    Dose adjustments this admission: N/a  Microbiology results:   Thank you for allowing pharmacy to be a part of this patient's care.  Benetta Spar Leanna Hamid 12/13/2020 2:17 PM

## 2020-12-13 NOTE — Anesthesia Postprocedure Evaluation (Signed)
Anesthesia Post Note  Patient: Joyce Crawford  Procedure(s) Performed: CESAREAN SECTION     Patient location during evaluation: Mother Baby Anesthesia Type: Epidural Level of consciousness: oriented and awake and alert Pain management: pain level controlled Vital Signs Assessment: post-procedure vital signs reviewed and stable Respiratory status: spontaneous breathing and respiratory function stable Cardiovascular status: blood pressure returned to baseline and stable Postop Assessment: no headache, no backache, no apparent nausea or vomiting and able to ambulate Anesthetic complications: no   No notable events documented.  Last Vitals:  Vitals:   12/13/20 1810 12/13/20 1932  BP:  125/74  Pulse:  80  Resp:    Temp: 36.9 C 37.3 C  SpO2:      Last Pain:  Vitals:   12/13/20 1932  TempSrc: Oral  PainSc: 0-No pain   Pain Goal:                   Trevor Iha

## 2020-12-13 NOTE — Progress Notes (Signed)
Labor Progress Note Joyce Crawford is a 30 y.o. G1P0 at [redacted]w[redacted]d presented for IOL post dates. S:  Pt is utilizing her peanut ball. Notes of increased pressure and pain.   O:  BP 129/78   Pulse 85   Temp 99.4 F (37.4 C) (Axillary)   Resp 16   Ht 5' (1.524 m)   Wt 72.6 kg   LMP 03/01/2020 Comment: per patient  SpO2 100%   BMI 31.25 kg/m  EFM: 150bpm/min variability/-accels, -decels. Toco: q2-4 min  CVE: Dilation: Lip/rim Effacement (%): 90 Station: 0 Presentation: Vertex Exam by:: Ileana Ladd, RN   A&P: 30 y.o. G1P0 [redacted]w[redacted]d IOL post dates. #Labor:  labor, s/p cytotec x 3. On pitocin 54ml/hr. SROM@2230 -light mec. Pt is almost complete and almost ready to begin pushing.  #Pain: Epidural in place #FWB: Cat 2>no late decels present, fluctuates between minimal and moderate variability-will continue to monitor, currently doing position changes  #Triple I: Pt had a fever of 100.6 with fetal tachy@0949 . From concern for triple I, we continued with amp/gent and tylenol. Pt has no had another fever today. Closely monitor.  #GBS positive< PCN   Alfredo Martinez, MD 1:01 PM

## 2020-12-13 NOTE — Progress Notes (Signed)
At bedside for evaluation of current management. Pt has been pushing for over 3hours.  SVE: C/C/0 with caput noted.  Minimal descent with pushing.  Based on exam, would recommend C-section at this time for suspected CPD and arrest of descent.  Risk benefits and alternatives of cesarean section were discussed with the patient including but not limited to infection, bleeding, damage to bowel , bladder and baby with the need for further surgery. Pt voiced understanding and desires to proceed.    FWB- Cat 2- variables with pushing OR notified of case  Myna Hidalgo, DO Attending Obstetrician & Gynecologist, [facility] [employer], [facility]

## 2020-12-13 NOTE — Anesthesia Procedure Notes (Addendum)
Epidural Patient location during procedure: OB Start time: 12/13/2020 4:26 AM End time: 12/13/2020 4:49 AM  Staffing Anesthesiologist: Trevor Iha, MD Performed: anesthesiologist   Preanesthetic Checklist Completed: patient identified, IV checked, site marked, risks and benefits discussed, surgical consent, monitors and equipment checked, pre-op evaluation and timeout performed  Epidural Patient position: sitting Prep: DuraPrep and site prepped and draped Patient monitoring: continuous pulse ox and blood pressure Approach: midline Location: L3-L4 Injection technique: LOR air  Needle:  Needle type: Tuohy  Needle gauge: 18 G Needle length: 9 cm and 9 Needle insertion depth: 4 cm Catheter type: closed end flexible Catheter size: 20 Guage Catheter at skin depth: 10 cm Test dose: negative  Assessment Events: blood not aspirated, injection not painful, no injection resistance, no paresthesia and negative IV test  Additional Notes Patient identified. Risks/Benefits/Options discussed with patient including but not limited to bleeding, infection, nerve damage, paralysis, failed block, incomplete pain control, headache, blood pressure changes, nausea, vomiting, reactions to medication both or allergic, itching and postpartum back pain. Confirmed with bedside nurse the patient's most recent platelet count. Confirmed with patient that they are not currently taking any anticoagulation, have any bleeding history or any family history of bleeding disorders. Patient expressed understanding and wished to proceed. All questions were answered. Sterile technique was used throughout the entire procedure. Please see nursing notes for vital signs. Test dose was given through epidural needle and negative prior to continuing to dose epidural or start infusion. Warning signs of high block given to the patient including shortness of breath, tingling/numbness in hands, complete motor block, or any  concerning symptoms with instructions to call for help. Patient was given instructions on fall risk and not to get out of bed. All questions and concerns addressed with instructions to call with any issues.  3 Attempt (S) because patient had difficulty remaining still during the procedurePatient tolerated procedure well.

## 2020-12-13 NOTE — Progress Notes (Signed)
I pushed with patient x 1 hour with various techniques used including pt position change, tug of war, rectal and ischial spine pressure applied by provider during pushing.  Total pushing time for pt 2.5 hours, nearing 3 hours.  Minimal descent of fetal head, acynclitic position of fetal head noted.  Dr Charlotta Newton called and to bedside.  Dr Charlotta Newton pushed with pt and assessed.  Fetal head too high to offer vacuum.  Dr Charlotta Newton discussed options including pt pushing 30 min to 1 hour more and reassessing vs cesarean section now.  Pt and husband decided to proceed with cesarean.  Pt stable, Category I FHR tracing at this time.

## 2020-12-13 NOTE — Discharge Summary (Signed)
Postpartum Discharge Summary      Patient Name: Joyce Crawford DOB: 1990-05-11 MRN: [id number]  Date of admission: 12/12/2020 Delivery date:12/13/2020  Delivering provider: MODY, Pollyann Glen and Emeterio Reeve Date of discharge: 12/16/2020  Admitting diagnosis: Post-dates pregnancy [O48.0] Encounter for supervision of normal first pregnancy in third trimester [Z34.03] Intrauterine pregnancy: [redacted]w[redacted]d    Secondary diagnosis:  Active Problems:   Post-dates pregnancy   Chorioamnionitis   Cesarean delivery delivered   Encounter for supervision of normal first pregnancy in third trimester  Additional problems: None    Discharge diagnosis: Term Pregnancy Delivered                                              Post partum procedures: None Complications: [id number] Hospital course: Induction of Labor With Cesarean Section   30y.o. yo G1P0 at 433w0das admitted to the hospital 12/12/2020 for induction of labor. Patient had a labor course significant for arrest of descent at 0 station and pushing for ~3 hours. The patient went for cesarean section due to Arrest of Dilation and Arrest of Descent. Delivery details are as follows: Membrane Rupture Time/Date: 10:40 PM ,12/12/2020   Delivery Method:C-Section, Low Transverse  Details of operation can be found in separate operative Note.  Patient had a postpartum course complicated by mildly symptomatic anemia (fatigue). Given IV Venofer and D/C'd on Iron.  She is ambulating, tolerating a regular diet, passing flatus, and urinating well.  Patient is discharged home in stable condition on 12/16/20.      Newborn Data: Birth date:12/13/2020  Birth time:9:34 PM  Gender:Female  Living status:Living  Apgars:7 ,8  Weight:3505 g                                Magnesium Sulfate received: No BMZ received: No Rhophylac:No MMR:No T-DaP:Given prenatally Flu: N/A Transfusion:No  Physical exam  Vitals:   12/15/20 0508 12/15/20 1410  12/15/20 2021 12/16/20 0527  BP: 99/62 114/84 113/84 116/85  Pulse: 87 82 73 81  Resp: _0 Temp: 98.1 F (36.7 C) 98.7 F (37.1 C) 98.2 F (36.8 C) 98 F (36.7 C)  TempSrc: Oral  Oral Oral  SpO2: 100%  100% 100%  Weight:      Height:       General: alert, cooperative, and no distress. Pale Lochia: appropriate Uterine Fundus: firm Incision: Dressing 1/2 saturated with old blood. Removed. No active bleeding. No erythema, drainage. Well-approximated. Steristrips in place. New Honey comb dressing applied.  DVT Evaluation: No evidence of DVT seen on physical exam. Significant bilat Calf/Ankle edema is present Labs: Lab Results  Component Value Date   WBC 15.5 (H) 12/14/2020   HGB 7.4 (L) 12/14/2020   HCT 23.6 (L) 12/14/2020   MCV 80.8 12/14/2020   PLT 248 12/14/2020   CMP Latest Ref Rng & Units 08/05/2016  Glucose 65 - 99 mg/dL 89  BUN 7 - 25 mg/dL 12  Creatinine 0.50 - 1.10 mg/dL 0.75  Sodium 135 - 146 mmol/L 138  Potassium 3.5 - 5.3 mmol/L 4.0  Chloride 98 - 110 mmol/L 105  CO2 20 - 31 mmol/L 23  Calcium 8.6 - 10.2 mg/dL 9.3  Total Protein 6.1 - 8.1 g/dL 7.7  Total Bilirubin 0.2 - 1.2  mg/dL 0.2  Alkaline Phos 33 - 115 U/L 61  AST 10 - 30 U/L 17  ALT 6 - 29 U/L 4(L)   Flavia Shipper Score: No flowsheet data found.   After visit meds:  Allergies as of 12/16/2020   No Known Allergies      Medication List     TAKE these medications    acetaminophen 500 MG tablet Commonly known as: TYLENOL Take 2 tablets (1,000 mg total) by mouth every 6 (six) hours as needed for mild pain, moderate pain, fever or headache.   calcium-vitamin D 250-100 MG-UNIT tablet Take 1 tablet by mouth daily.   ferrous sulfate 325 (65 FE) MG tablet Take 1 tablet (325 mg total) by mouth every other day.   hydrochlorothiazide 12.5 MG capsule (For edema) Commonly known as: Microzide Take 1 capsule (12.5 mg total) by mouth daily.   ibuprofen 600 MG tablet Commonly known as:  ADVIL Take 1 tablet (600 mg total) by mouth every 6 (six) hours as needed for mild pain, fever, headache, moderate pain or cramping.   oxyCODONE 5 MG immediate release tablet Commonly known as: Oxy IR/ROXICODONE Take 1-2 tablets (5-10 mg total) by mouth every 6 (six) hours as needed for severe pain.   polyethylene glycol powder 17 GM/SCOOP powder Commonly known as: GLYCOLAX/MIRALAX Take 17 g by mouth daily as needed for moderate constipation.   PRENATAL VITAMIN PO Take 1 tablet by mouth daily.         Discharge home in stable condition Infant Feeding: Bottle and Breast Infant Disposition:home with mother Discharge instruction: per After Visit Summary and Postpartum booklet. Activity: Advance as tolerated. Pelvic rest for 6 weeks.  Diet: routine diet Future Appointments:No future appointments. Follow up Visit: Message sent to [facility] by Dr. Cy Blamer on 8/11  Please schedule this patient for a In person postpartum visit in 6 weeks with the following provider: Any provider. Additional Postpartum F/U:Incision check 1 week  Low risk pregnancy complicated by:  None Delivery mode:  C-Section, Low Transverse  Anticipated Birth Control:   Defers at this time  Discussed importance of adequate birth spacing > 2 years to reduce risk of uterine rupture in future pregnancies.    12/16/2020 Manya Silvas, CNM

## 2020-12-13 NOTE — Progress Notes (Signed)
DELAYED ENTRY DUE TO EPICS DOWNTIME, SEEN 0100  Labor Progress Note Eva Nonaka is a 30 y.o. G1P0 at [redacted]w[redacted]d presented for IOL post dates S:  Doing okay, her contractions are more uncomfortable and she wants IV meds  O:  BP 128/79   Pulse 80   Temp 98.4 F (36.9 C) (Oral)   Resp 14   Ht 5' (1.524 m)   Wt 72.6 kg   LMP 03/01/2020 Comment: per patient  BMI 31.25 kg/m  EFM: 145bpm/moderate variability/+accels, no decels Toco: q2-4 min  CVE: 3-4/90/-2 per RN   A&P: 30 y.o. G1P0 [redacted]w[redacted]d IOL post dates #Labor: Early labor, s/p cytotec x 3, start pitocin and titrate per protocol #Pain: PRN  #FWB: Cat 1 #GBS positive< PCN   Nelson Chimes, MD 3:11 AM

## 2020-12-14 ENCOUNTER — Encounter (HOSPITAL_COMMUNITY): Payer: Self-pay | Admitting: Obstetrics & Gynecology

## 2020-12-14 DIAGNOSIS — Z3403 Encounter for supervision of normal first pregnancy, third trimester: Secondary | ICD-10-CM

## 2020-12-14 LAB — CBC
HCT: 23.6 % — ABNORMAL LOW (ref 36.0–46.0)
Hemoglobin: 7.4 g/dL — ABNORMAL LOW (ref 12.0–15.0)
MCH: 25.3 pg — ABNORMAL LOW (ref 26.0–34.0)
MCHC: 31.4 g/dL (ref 30.0–36.0)
MCV: 80.8 fL (ref 80.0–100.0)
Platelets: 248 10*3/uL (ref 150–400)
RBC: 2.92 MIL/uL — ABNORMAL LOW (ref 3.87–5.11)
RDW: 14.1 % (ref 11.5–15.5)
WBC: 15.5 10*3/uL — ABNORMAL HIGH (ref 4.0–10.5)
nRBC: 0 % (ref 0.0–0.2)

## 2020-12-14 MED ORDER — LACTATED RINGERS IV SOLN: INTRAVENOUS | Status: DC

## 2020-12-14 MED ORDER — MENTHOL 3 MG MT LOZG: 1.0000 | LOZENGE | OROMUCOSAL | Status: DC | PRN

## 2020-12-14 MED ORDER — SIMETHICONE 80 MG PO CHEW
80.0000 mg | CHEWABLE_TABLET | Freq: Three times a day (TID) | ORAL | Status: DC
  Administered 2020-12-14 – 2020-12-16 (×5): 80 mg via ORAL
  Filled 2020-12-14 (×5): qty 1

## 2020-12-14 MED ORDER — WITCH HAZEL-GLYCERIN EX PADS: 1.0000 "application " | MEDICATED_PAD | CUTANEOUS | Status: DC | PRN

## 2020-12-14 MED ORDER — MEASLES, MUMPS & RUBELLA VAC IJ SOLR: 0.5000 mL | Freq: Once | INTRAMUSCULAR | Status: DC

## 2020-12-14 MED ORDER — SIMETHICONE 80 MG PO CHEW: 80.0000 mg | CHEWABLE_TABLET | ORAL | Status: DC | PRN

## 2020-12-14 MED ORDER — PRENATAL MULTIVITAMIN CH
1.0000 | ORAL_TABLET | Freq: Every day | ORAL | Status: DC
  Administered 2020-12-14 – 2020-12-16 (×3): 1 via ORAL
  Filled 2020-12-14 (×3): qty 1

## 2020-12-14 MED ORDER — OXYCODONE HCL 5 MG PO TABS
5.0000 mg | ORAL_TABLET | Freq: Four times a day (QID) | ORAL | Status: DC | PRN
  Administered 2020-12-15 – 2020-12-16 (×4): 5 mg via ORAL
  Filled 2020-12-14 (×4): qty 1

## 2020-12-14 MED ORDER — OXYTOCIN-SODIUM CHLORIDE 30-0.9 UT/500ML-% IV SOLN
2.5000 [IU]/h | INTRAVENOUS | Status: AC
  Administered 2020-12-14: 2.5 [IU]/h via INTRAVENOUS

## 2020-12-14 MED ORDER — ACETAMINOPHEN 325 MG PO TABS
650.0000 mg | ORAL_TABLET | Freq: Four times a day (QID) | ORAL | Status: DC
  Administered 2020-12-14 – 2020-12-15 (×6): 650 mg via ORAL
  Filled 2020-12-14 (×7): qty 2

## 2020-12-14 MED ORDER — DIBUCAINE (PERIANAL) 1 % EX OINT: 1.0000 "application " | TOPICAL_OINTMENT | CUTANEOUS | Status: DC | PRN

## 2020-12-14 MED ORDER — COCONUT OIL OIL
1.0000 "application " | TOPICAL_OIL | Status: DC | PRN
  Administered 2020-12-15: 1 via TOPICAL

## 2020-12-14 MED ORDER — SENNOSIDES-DOCUSATE SODIUM 8.6-50 MG PO TABS
2.0000 | ORAL_TABLET | Freq: Every day | ORAL | Status: DC
  Administered 2020-12-14 – 2020-12-16 (×3): 2 via ORAL
  Filled 2020-12-14 (×3): qty 2

## 2020-12-14 MED ORDER — IBUPROFEN 600 MG PO TABS
600.0000 mg | ORAL_TABLET | Freq: Four times a day (QID) | ORAL | Status: DC | PRN
  Administered 2020-12-14 – 2020-12-16 (×7): 600 mg via ORAL
  Filled 2020-12-14 (×7): qty 1

## 2020-12-14 MED ORDER — TETANUS-DIPHTH-ACELL PERTUSSIS 5-2.5-18.5 LF-MCG/0.5 IM SUSY: 0.5000 mL | PREFILLED_SYRINGE | Freq: Once | INTRAMUSCULAR | Status: DC

## 2020-12-14 MED ORDER — SODIUM CHLORIDE 0.9 % IV SOLN
500.0000 mg | Freq: Once | INTRAVENOUS | Status: AC
  Administered 2020-12-14: 500 mg via INTRAVENOUS
  Filled 2020-12-14: qty 25

## 2020-12-14 MED ORDER — DIPHENHYDRAMINE HCL 25 MG PO CAPS: 25.0000 mg | ORAL_CAPSULE | Freq: Four times a day (QID) | ORAL | Status: DC | PRN

## 2020-12-14 MED ORDER — ENOXAPARIN SODIUM 40 MG/0.4ML IJ SOSY
40.0000 mg | PREFILLED_SYRINGE | INTRAMUSCULAR | Status: DC
  Administered 2020-12-14 – 2020-12-15 (×2): 40 mg via SUBCUTANEOUS
  Filled 2020-12-14 (×2): qty 0.4

## 2020-12-14 NOTE — Progress Notes (Signed)
POSTPARTUM PROGRESS NOTE  Subjective: Joyce Crawford is a 30 y.o. G1P1001 s/p POD#1 LTCS at [redacted]w[redacted]d.  She reports she doing well. No acute events overnight. She denies any problems with po intake. Denies nausea or vomiting. She has passed flatus. Pt still has foley in. Pain is moderately controlled.  Lochia is appropriate.   Objective: Blood pressure (!) 115/55, pulse 60, temperature 98.3 F (36.8 C), temperature source Oral, resp. rate 18, height 5' (1.524 m), weight 72.6 kg, last menstrual period 03/01/2020, SpO2 99 %, unknown if currently breastfeeding.  Physical Exam:  General: alert, cooperative and no distress Chest: no respiratory distress Abdomen: soft, non-tender  Uterine Fundus: firm and at level of umbilicus Dressing clean, dry, and intact  Extremities: No calf swelling or tenderness  No edema  Recent Labs    12/12/20 0843 12/14/20 0508  HGB 10.3* 7.4*  HCT 32.4* 23.6*    Assessment/Plan: Dorcas Stahle is a 30 y.o. G1P1001 s/p  POD#1 LTCS at [redacted]w[redacted]d..  Routine Postpartum Care: Doing well, pain well-controlled.  -- Continue routine care, lactation support  -- Contraception: None-declined  -- Feeding: br/bottle -- IV venofer for hgb of 7.4 dropped from 10.3 -- S/P one dose of amp/gent- one fever on 8/11 of 100.6- has remained afebrile since -- Will order SW consult-hx of depression/anxiety-no medicine  -- D/c foley today   Dispo: Plan for discharge POD#2-3.  Alfredo Martinez, MD Faculty Practice, [facility] 12/14/2020 10:46 AM

## 2020-12-14 NOTE — Lactation Note (Addendum)
This note was copied from a baby's chart. Lactation Consultation Note  Patient Name: Joyce Crawford [id number] Date: 12/14/2020 Reason for consult: Initial assessment Age:30 hours  P1, Video interpreter used for [place]  FOB speaks english. Goal:  Breastfeed and formula feed.  Reviewed hand expression with drops. Assisted with latching baby in football hold.   Baby latched with ease, lips flanged. During consult mother expressed concern over her milk supply and asked if she drank certain beverages would they help her supply. Discussed the importance of breastfeeding before offering formula and supply and demand. Mom made aware of O/P services, breastfeeding support groups, community resources, and our phone # for post-discharge questions.      Feeding Mother's Current Feeding Choice: Breast Milk and Formula Nipple Type: Extra Slow Flow  LATCH Score Latch: Grasps breast easily, tongue down, lips flanged, rhythmical sucking.  Audible Swallowing: A few with stimulation  Type of Nipple: Everted at rest and after stimulation  Comfort (Breast/Nipple): Soft / non-tender  Hold (Positioning): Assistance needed to correctly position infant at breast and maintain latch.  LATCH Score: 8  Interventions Interventions: Breast feeding basics reviewed;Assisted with latch;Skin to skin;Hand express;Support pillows;Education  Discharge Pump: Advised to call insurance company  Consult Status Consult Status: Follow-up Date: 12/14/20 Follow-up type: In-patient    Dahlia Byes [facility] 12/14/2020, 8:25 AM

## 2020-12-14 NOTE — Social Work (Signed)
CSW received consult for hx of Anxiety and Depression.  CSW met with MOB to offer support and complete assessment.    CSW met with MOB at bedside. CSW congratulated MOB, FOB and introduced CSW role. CSW observed MOB STS with the infant. FOB was present at bedside. CSW offered to use the interpreter, MOB declined the use. MOB gave CSW permission to complete the assessment while FOB present. CSW inquired how MOB has felt emotionally since giving birth. MOB expressed feeling ok. CSW inquired about MOB history of depression and anxiety. MOB shared she had a depressed mood at times during her pregnancy because she felt bored after she stopped working. 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 MOB shared she had anxious thoughts about the birth and delivery. FOB explained that MOB most recent panic attack was situational, and it happened 3-4 years ago. MOB reported no therapy or medication treatment. CSW provided education regarding the baby blues period vs. perinatal mood disorders, discussed treatment and gave resources for mental health follow up.  CSW recommended MOB complete a self-evaluation during the postpartum time period using the New Mom Checklist from Postpartum Progress and encouraged MOB to contact a medical professional if symptoms are noted at any time. FOB stated he will use the postpartum evaluation sheet and help monitor MOB. MOB and FOB were receptive to the idea to reach out to MOB physician if concerns arise. CSW assessed for safety. MOB denied thoughts of harm to self and others. CSW inquired about MOB supports. MOB identified FOB as a support.   CSW provided review of Sudden Infant Death Syndrome (SIDS) precautions. MOB reported she has essential items for the infant including a bassinet where the infant will sleep. MOB has chosen [facility] for infant's follow up care. CSW assessed MOB for additional needs. MOB reported  no further need.     CSW identifies no further need for intervention and no barriers to discharge at this time.   Nicole Sinclair, MSW, LCSW [facility]  Clinical Social Worker  [phone] 12/14/2020  11:49 AM 

## 2020-12-15 NOTE — Progress Notes (Signed)
Post Partum Day 2 Subjective: no complaints, up ad lib, voiding, tolerating PO, + flatus, and pain well controlled with ibuprofen  Objective: Blood pressure 99/62, pulse 87, temperature 98.1 F (36.7 C), temperature source Oral, resp. rate 18, height 5' (1.524 m), weight 160 lb (72.6 kg), last menstrual period 03/01/2020, SpO2 100 %, unknown if currently breastfeeding. (Bottle feeding confirmed)  Physical Exam:  General: alert, cooperative, appears stated age, fatigued, and no distress Lochia: appropriate Uterine Fundus: firm Incision: healing well, no significant drainage, no significant erythema DVT Evaluation: No evidence of DVT seen on physical exam.  Recent Labs    12/14/20 0508  HGB 7.4*  HCT 23.6*  Has received venofer, asymptomatic now  Assessment/Plan: Plan for discharge tomorrow Reviewed records re: possible need for SW consult. GAD-7 and PHQ-9 scores low when screening in late 3rd trimester and pt has good awareness of symptoms and resources available to her.    LOS: 3 days   Bernerd Limbo 12/15/2020, 11:24 AM

## 2020-12-16 MED ORDER — HYDROCHLOROTHIAZIDE 12.5 MG PO CAPS: 12.5000 mg | ORAL_CAPSULE | Freq: Every day | ORAL | 0 refills | Status: AC

## 2020-12-16 MED ORDER — FERROUS SULFATE 325 (65 FE) MG PO TABS: 325.0000 mg | ORAL_TABLET | ORAL | 0 refills | Status: AC

## 2020-12-16 MED ORDER — POLYETHYLENE GLYCOL 3350 17 GM/SCOOP PO POWD: 17.0000 g | Freq: Every day | ORAL | 2 refills | Status: AC | PRN

## 2020-12-16 MED ORDER — ACETAMINOPHEN 500 MG PO TABS: 1000.0000 mg | ORAL_TABLET | Freq: Four times a day (QID) | ORAL | 1 refills | Status: AC | PRN

## 2020-12-16 MED ORDER — OXYCODONE HCL 5 MG PO TABS: 5.0000 mg | ORAL_TABLET | Freq: Four times a day (QID) | ORAL | 0 refills | Status: AC | PRN

## 2020-12-16 MED ORDER — HYDROCHLOROTHIAZIDE 12.5 MG PO CAPS
12.5000 mg | ORAL_CAPSULE | Freq: Once | ORAL | Status: AC
  Administered 2020-12-16: 12.5 mg via ORAL
  Filled 2020-12-16: qty 1

## 2020-12-16 MED ORDER — IBUPROFEN 600 MG PO TABS: 600.0000 mg | ORAL_TABLET | Freq: Four times a day (QID) | ORAL | 1 refills | Status: AC | PRN

## 2020-12-16 NOTE — Lactation Note (Signed)
This note was copied from a baby's chart. Lactation Consultation Note  Patient Name: Joyce Crawford [id number] Date: 12/16/2020 Reason for consult: Follow-up assessment;Primapara;1st time breastfeeding;Term Age:30 hours   P1 mother whose infant is now 80 hours old.  This is a term baby at 41+0 weeks.  Mother's current feeding preference is breast/formula.  RN requested lactation support.  Mother has not been latching to the breast > 24 hours.  She is not able to obtain any colostrum drops and reports being very sore.  She is "taking a break" from latching at this time.  Upon observation, mother's nipples are slightly irritated, however, I notice no trauma.  Mother has used coconut oil occasionally for comfort.    Discussed the feeding plan for after discharge.  Currently, mother does not have a DEBP for home use.  Family has private insurance but father has not called the company to determine eligibility.  Suggested he call now so we can formulate a plan for after discharge.  He will ask if he can purchase a pump locally and get reimbursed.  Also, offered to return at the next feeding to observe mother latching and provide additional support and guidance.  Mother will call for my assistance.  RN updated.   Maternal Data Has patient been taught Hand Expression?: Yes Does the patient have breastfeeding experience prior to this delivery?: No  Feeding Mother's Current Feeding Choice: Breast Milk and Formula  LATCH Score                    Lactation Tools Discussed/Used    Interventions Interventions: Education  Discharge Pump: Personal (Spoke with family regarding obtaining a DEBP) WIC Program: No  Consult Status Consult Status: Complete Date: 12/16/20 Follow-up type: Call as needed    Coline Calkin R Djeneba Barsch 12/16/2020, 12:00 PM

## 2020-12-17 ENCOUNTER — Telehealth: Payer: Self-pay | Admitting: General Practice

## 2020-12-17 LAB — SURGICAL PATHOLOGY

## 2020-12-17 NOTE — Telephone Encounter (Signed)
Transition Care Management Unsuccessful Follow-up Telephone Call  Date of discharge and from where:  12/16/20 from [facility]  Attempts:  1st Attempt  Reason for unsuccessful TCM follow-up call:  No answer/busy

## 2020-12-19 NOTE — Telephone Encounter (Signed)
Transition Care Management Unsuccessful Follow-up Telephone Call  Date of discharge and from where:  12/16/20 from [facility]  Attempts:  2nd Attempt  Reason for unsuccessful TCM follow-up call:  No answer/busy

## 2020-12-20 NOTE — Telephone Encounter (Signed)
Transition Care Management Unsuccessful Follow-up Telephone Call  Date of discharge and from where:  12/16/2020 from [facility]  Attempts:  3rd Attempt  Reason for unsuccessful TCM follow-up call:  Unable to reach patient

## 2020-12-24 ENCOUNTER — Other Ambulatory Visit: Payer: Self-pay

## 2020-12-24 ENCOUNTER — Ambulatory Visit (INDEPENDENT_AMBULATORY_CARE_PROVIDER_SITE_OTHER): Payer: Managed Care, Other (non HMO) | Admitting: Obstetrics & Gynecology

## 2020-12-24 ENCOUNTER — Encounter: Payer: Self-pay | Admitting: Obstetrics & Gynecology

## 2020-12-24 VITALS — BP 98/60 | HR 2 | Temp 98.2°F | Ht 62.0 in | Wt 137.0 lb

## 2020-12-24 DIAGNOSIS — R399 Unspecified symptoms and signs involving the genitourinary system: Secondary | ICD-10-CM

## 2020-12-24 DIAGNOSIS — R102 Pelvic and perineal pain: Secondary | ICD-10-CM

## 2020-12-24 LAB — POCT URINALYSIS DIPSTICK
Bilirubin, UA: NEGATIVE
Glucose, UA: NEGATIVE
Ketones, UA: NEGATIVE
Leukocytes, UA: NEGATIVE
Nitrite, UA: NEGATIVE
Protein, UA: NEGATIVE
Spec Grav, UA: 1.015 (ref 1.010–1.025)
Urobilinogen, UA: 0.2 E.U./dL
pH, UA: 5 (ref 5.0–8.0)

## 2020-12-24 NOTE — Progress Notes (Signed)
   Subjective:    Patient ID: Joyce Crawford, female    DOB: May 03, 1991, 30 y.o.   MRN: [id number]  HPI  30 year old female G1 para 1-0-0-1 presents for incision check.  Patient feels heaviness in her vagina and bladder.  Patient did have a C-section for failure to descend after pushing 2 and half hours.  Patient denies discharge from her incision.  She denies dysuria.  Patient has having minimal lochia.  Patient denies bowel movement complaints.  Patient denies anxiety or depression.  Review of Systems  Constitutional: Negative.   Respiratory: Negative.    Cardiovascular: Negative.   Gastrointestinal: Negative.   Genitourinary: Negative.        Pelvic pressure  Neurological: Negative.   Psychiatric/Behavioral:  Negative for dysphoric mood. The patient is nervous/anxious.       Objective:   Physical Exam Vitals reviewed.  Constitutional:      General: She is not in acute distress.    Appearance: She is well-developed.  HENT:     Head: Normocephalic and atraumatic.  Eyes:     Conjunctiva/sclera: Conjunctivae normal.  Cardiovascular:     Rate and Rhythm: Normal rate.  Pulmonary:     Effort: Pulmonary effort is normal.  Abdominal:     General: Abdomen is flat. There is no distension.     Palpations: Abdomen is soft. There is no mass.     Tenderness: There is no guarding or rebound.     Comments: Mildly tender with deep palpation and appropriate for 1 week post c/s.  Genitourinary:    Comments: Tanner V Minimal lochia. Uterus is well supported.  Bladder is nontender.  Uterus is nontender. Skin:    General: Skin is warm and dry.  Neurological:     Mental Status: She is alert and oriented to person, place, and time.  Psychiatric:        Mood and Affect: Mood normal.   PHQ9=0 GAD=0      Assessment & Plan:  30 year old female here for incision check  Incision is well-healed.  No erythema or redness. Pelvic exam reveals no pelvic organ prolapse Will get UA urine and  culture No evidence of depression or anxiety Return to clinic in 4 to 5 weeks.

## 2020-12-24 NOTE — Progress Notes (Signed)
Edinburgh Score: 0

## 2020-12-26 LAB — URINE CULTURE
MICRO NUMBER:: 12275369
SPECIMEN QUALITY:: ADEQUATE

## 2020-12-27 ENCOUNTER — Telehealth (HOSPITAL_COMMUNITY): Payer: Self-pay | Admitting: *Deleted

## 2020-12-27 NOTE — Telephone Encounter (Signed)
Hospital discharge follow-up call attempted by interpreter. No answer received. Deforest Hoyles, RN, 12/27/20, [phone].

## 2021-01-22 NOTE — Progress Notes (Signed)
Post Partum Visit Note  Joyce Crawford is a 30 y.o. G65P1001 female who presents for a postpartum visit. She is 6 weeks postpartum following a primary cesarean section.  I have fully reviewed the prenatal and intrapartum course. The delivery was at [redacted]w[redacted]d gestational weeks.  Anesthesia: epidural. Postpartum course has been uncomplicated. Baby is doing well. Baby is feeding by both breast and bottle - Similac . Bleeding staining only. Bowel function is normal. Bladder function is normal. Patient is not sexually active. Contraception method is none. Postpartum depression screening: positive- score 13. She denies feeling depressed but has episodes of being overwhelmed. Clarified she denies thoughts of self harm. She does not feel like she needs medicine at this time.    The pregnancy intention screening data noted above was reviewed. Potential methods of contraception were discussed. The patient elected to proceed with no birth control.    Edinburgh Postnatal Depression Scale - 01/24/21 1458       Edinburgh Postnatal Depression Scale:  In the Past 7 Days   I have been able to laugh and see the funny side of things. 1    I have looked forward with enjoyment to things. 1    I have blamed myself unnecessarily when things went wrong. 1    I have been anxious or worried for no good reason. 2    I have felt scared or panicky for no good reason. 2    Things have been getting on top of me. 2    I have been so unhappy that I have had difficulty sleeping. 1    I have felt sad or miserable. 1    I have been so unhappy that I have been crying. 1    The thought of harming myself has occurred to me. 1    Edinburgh Postnatal Depression Scale Total 13             Health Maintenance Due  Topic Date Due   INFLUENZA VACCINE  Never done    The following portions of the patient's history were reviewed and updated as appropriate: allergies, current medications, past family history, past medical history,  past social history, past surgical history, and problem list.  Review of Systems Pertinent items are noted in HPI.  Objective:  BP 118/81   Pulse 76   Ht 5\' 4"  (1.626 m)   Wt 138 lb (62.6 kg)   BMI 23.69 kg/m    General:  alert and no distress   Breasts:  not indicated  Lungs: clear to auscultation bilaterally  Heart:  regular rate and rhythm  Abdomen: soft, non-tender; bowel sounds normal; no masses,  no organomegaly   Wound well approximated incision  GU exam:  not indicated       Assessment:    1. Cesarean delivery delivered - Wound healing well, no complications  2. Postpartum care following cesarean delivery - see below   6 wks postpartum exam.   Plan:   Essential components of care per ACOG recommendations:  1.  Mood and well being: Patient with positive depression screening today. Reviewed local resources for support. She declines medication. She has good support from spouse. Will send future message to call and check in on her.  - Patient tobacco use? No.   - hx of drug use? No.    2. Infant care and feeding:  -Patient currently breastmilk feeding? Yes. Discussed returning to work and pumping. She is also supplementing with breast milk -Social determinants  of health (SDOH) reviewed in EPIC. No concerns  3. Sexuality, contraception and birth spacing - Patient does not want a pregnancy in the next year.  Desired family size is 2-3 children.  - Reviewed forms of contraception in tiered fashion. Patient desired no method today.   - Discussed birth spacing of 18 months  4. Sleep and fatigue -Encouraged family/partner/community support of 4 hrs of uninterrupted sleep to help with mood and fatigue  5. Physical Recovery  - Discussed patients delivery and complications. She describes her labor as good. - Patient had a C-section - due to arrest of descent in the second stage. Patient expressed understanding - Patient has urinary incontinence? No. - Patient is  safe to resume physical and sexual activity - Will check in on back pain in one month- if still present, refer to PT  6.  Health Maintenance - HM due items addressed Yes - Last pap smear  Diagnosis  Date Value Ref Range Status  05/15/2020   Final   - Negative for intraepithelial lesion or malignancy (NILM)   Pap smear not done at today's visit.  -Breast Cancer screening indicated? No.   7. Chronic Disease/Pregnancy Condition follow up: None  - PCP follow up  Milas Hock, MD [facility] for [facility], [facility]

## 2021-01-24 ENCOUNTER — Ambulatory Visit (INDEPENDENT_AMBULATORY_CARE_PROVIDER_SITE_OTHER): Payer: Managed Care, Other (non HMO) | Admitting: Obstetrics and Gynecology

## 2021-01-24 ENCOUNTER — Other Ambulatory Visit: Payer: Self-pay

## 2021-01-24 ENCOUNTER — Encounter: Payer: Self-pay | Admitting: Obstetrics and Gynecology

## 2021-01-24 NOTE — Progress Notes (Deleted)
    Post Partum Visit Note   Lack of sleep  Mild back pain Ibuprofen continued, 1 pill a day      Joyce Crawford is a 30 y.o. G65P1001 female who presents for a postpartum visit. She is 6 weeks postpartum following a primary cesarean section.  I have fully reviewed the prenatal and intrapartum course. The delivery was at [redacted]w[redacted]d gestational weeks.  Anesthesia: epidural. Postpartum course has been uncomplicated. Baby is doing well. Baby is feeding by both breast and bottle - Similac . Bleeding staining only. Bowel function is normal. Bladder function is normal. Patient is not sexually active. Contraception method is none. Postpartum depression screening: positive.   The pregnancy intention screening data noted above was reviewed. Potential methods of contraception were discussed. The patient elected to proceed with No data recorded.    Health Maintenance Due  Topic Date Due   INFLUENZA VACCINE  Never done    The following portions of the patient's history were reviewed and updated as appropriate: allergies, current medications, past family history, past medical history, past social history, past surgical history, and problem list.  Review of Systems Pertinent items are noted in HPI.  Objective:  BP 118/81   Pulse 76   Ht 5\' 4"  (1.626 m)   Wt 138 lb (62.6 kg)   BMI 23.69 kg/m    General:  alert and no distress   Breasts:  not indicated  Lungs: clear to auscultation bilaterally  Heart:  regular rate and rhythm  Abdomen: soft, non-tender; bowel sounds normal; no masses,  no organomegaly   Wound well approximated incision  GU exam:  not indicated       Assessment:    1. Cesarean delivery delivered - Wound healing well, no complications  2. Postpartum care following cesarean delivery - see below   6 wks postpartum exam.   Plan:   Essential components of care per ACOG recommendations:  1.  Mood and well being: Patient with {gen negative/positive:315881} depression  screening today. Reviewed local resources for support.  - Patient tobacco use? No.   - hx of drug use? No.    2. Infant care and feeding:  -Patient currently breastmilk feeding? {yes/no:25502} , mostly bottle fed  -Social determinants of health (SDOH) reviewed in EPIC. No concerns  3. Sexuality, contraception and birth spacing - Patient does want a pregnancy in the next year.  Desired family size is {NUMBER 1-10:22536} children. 2-3 total  - Reviewed forms of contraception in tiered fashion. Patient desired {PLAN CONTRACEPTION:313102} today.   - Discussed birth spacing of 18 months  4. Sleep and fatigue -Encouraged family/partner/community support of 4 hrs of uninterrupted sleep to help with mood and fatigue  5. Physical Recovery  - Discussed patients delivery and complications. She describes her labor as {description:25511} - Patient had a C-section - due to arrest of descent in the second stage. Patient expressed understanding - Patient has urinary incontinence? No. - Patient is safe to resume physical and sexual activity  6.  Health Maintenance - HM due items addressed Yes - Last pap smear  Diagnosis  Date Value Ref Range Status  05/15/2020   Final   - Negative for intraepithelial lesion or malignancy (NILM)   Pap smear not done at today's visit.  -Breast Cancer screening indicated? No.   7. Chronic Disease/Pregnancy Condition follow up: None  - PCP follow up  07/13/2020, [facility], [facility]

## 2021-02-21 ENCOUNTER — Telehealth: Payer: Self-pay

## 2021-02-21 NOTE — Telephone Encounter (Addendum)
Attempted to call pt to check in with her about her mood and back pain. Pt did not answer. VM left for patient. MyChart message also sent.   ----- Message from Milas Hock, MD sent at 01/24/2021  3:30 PM EDT ----- Regarding: Mood and back pain check Can you check in on her mood? Denied depression at [facility] visit but accepted a check in.   Can you also check in on back pain? If still present, please refer to formal PT.   Thanks! Milas Hock
